# Patient Record
Sex: Female | Born: 1970 | Race: Black or African American | Hispanic: No | Marital: Married | Smoking: Never smoker
Health system: Southern US, Community
[De-identification: ages and names within clinical notes are randomized; demographics above are authoritative.]

## PROBLEM LIST (undated history)

## (undated) DIAGNOSIS — I471 Supraventricular tachycardia, unspecified: Secondary | ICD-10-CM

## (undated) DIAGNOSIS — M1990 Unspecified osteoarthritis, unspecified site: Secondary | ICD-10-CM

## (undated) DIAGNOSIS — K589 Irritable bowel syndrome without diarrhea: Secondary | ICD-10-CM

## (undated) DIAGNOSIS — S0300XA Dislocation of jaw, unspecified side, initial encounter: Secondary | ICD-10-CM

## (undated) DIAGNOSIS — G43909 Migraine, unspecified, not intractable, without status migrainosus: Secondary | ICD-10-CM

## (undated) DIAGNOSIS — T7840XA Allergy, unspecified, initial encounter: Secondary | ICD-10-CM

## (undated) DIAGNOSIS — H6690 Otitis media, unspecified, unspecified ear: Secondary | ICD-10-CM

## (undated) DIAGNOSIS — K219 Gastro-esophageal reflux disease without esophagitis: Secondary | ICD-10-CM

## (undated) DIAGNOSIS — D649 Anemia, unspecified: Secondary | ICD-10-CM

## (undated) HISTORY — PX: LAPAROSCOPIC TOTAL HYSTERECTOMY: SUR800

## (undated) HISTORY — DX: Allergy, unspecified, initial encounter: T78.40XA

## (undated) HISTORY — DX: Anemia, unspecified: D64.9

## (undated) HISTORY — PX: TYMPANOSTOMY TUBE PLACEMENT: SHX32

## (undated) HISTORY — PX: UPPER GASTROINTESTINAL ENDOSCOPY: SHX188

## (undated) HISTORY — DX: Gastro-esophageal reflux disease without esophagitis: K21.9

## (undated) HISTORY — DX: Unspecified osteoarthritis, unspecified site: M19.90

---

## 2001-05-16 ENCOUNTER — Other Ambulatory Visit: Admission: RE | Admit: 2001-05-16 | Discharge: 2001-05-16 | Payer: Self-pay | Admitting: Obstetrics and Gynecology

## 2002-05-26 ENCOUNTER — Ambulatory Visit (HOSPITAL_COMMUNITY): Admission: RE | Admit: 2002-05-26 | Discharge: 2002-05-26 | Payer: Self-pay | Admitting: Internal Medicine

## 2002-05-26 ENCOUNTER — Encounter (INDEPENDENT_AMBULATORY_CARE_PROVIDER_SITE_OTHER): Payer: Self-pay | Admitting: Internal Medicine

## 2002-05-29 ENCOUNTER — Other Ambulatory Visit: Admission: RE | Admit: 2002-05-29 | Discharge: 2002-05-29 | Payer: Self-pay | Admitting: Obstetrics and Gynecology

## 2006-07-19 ENCOUNTER — Other Ambulatory Visit: Admission: RE | Admit: 2006-07-19 | Discharge: 2006-07-19 | Payer: Self-pay | Admitting: Obstetrics & Gynecology

## 2006-08-02 ENCOUNTER — Ambulatory Visit (HOSPITAL_COMMUNITY): Admission: RE | Admit: 2006-08-02 | Discharge: 2006-08-02 | Payer: Self-pay | Admitting: Obstetrics & Gynecology

## 2009-12-13 ENCOUNTER — Ambulatory Visit (HOSPITAL_COMMUNITY): Admission: RE | Admit: 2009-12-13 | Discharge: 2009-12-13 | Payer: Self-pay | Admitting: Pulmonary Disease

## 2010-03-07 ENCOUNTER — Encounter (INDEPENDENT_AMBULATORY_CARE_PROVIDER_SITE_OTHER): Payer: Self-pay | Admitting: Obstetrics & Gynecology

## 2010-03-07 ENCOUNTER — Ambulatory Visit (HOSPITAL_COMMUNITY): Admission: RE | Admit: 2010-03-07 | Discharge: 2010-03-08 | Payer: Self-pay | Admitting: Obstetrics & Gynecology

## 2010-03-15 ENCOUNTER — Inpatient Hospital Stay (HOSPITAL_COMMUNITY): Admission: AD | Admit: 2010-03-15 | Discharge: 2010-03-15 | Payer: Self-pay | Admitting: Obstetrics & Gynecology

## 2010-03-15 DIAGNOSIS — R300 Dysuria: Secondary | ICD-10-CM

## 2010-03-15 DIAGNOSIS — N390 Urinary tract infection, site not specified: Secondary | ICD-10-CM

## 2010-11-01 LAB — PREGNANCY, URINE: Preg Test, Ur: NEGATIVE

## 2010-11-01 LAB — BASIC METABOLIC PANEL
BUN: 7 mg/dL (ref 6–23)
CO2: 25 mEq/L (ref 19–32)
CO2: 28 mEq/L (ref 19–32)
Calcium: 8.3 mg/dL — ABNORMAL LOW (ref 8.4–10.5)
Calcium: 9.4 mg/dL (ref 8.4–10.5)
Chloride: 102 mEq/L (ref 96–112)
Creatinine, Ser: 0.7 mg/dL (ref 0.4–1.2)
Creatinine, Ser: 0.74 mg/dL (ref 0.4–1.2)
GFR calc Af Amer: >60 mL/min (ref >60)
GFR calc Af Amer: >60 mL/min (ref >60)
GFR calc non Af Amer: >60 mL/min (ref >60)
GFR calc non Af Amer: >60 mL/min (ref >60)
Glucose, Bld: 75 mg/dL (ref 70–99)
Glucose, Bld: 91 mg/dL (ref 70–99)
Potassium: 3.3 mEq/L — ABNORMAL LOW (ref 3.5–5.1)
Sodium: 136 mEq/L (ref 135–145)
Sodium: 138 mEq/L (ref 135–145)

## 2010-11-01 LAB — URINE CULTURE: Colony Count: 35000

## 2010-11-01 LAB — URINALYSIS, ROUTINE W REFLEX MICROSCOPIC
Bilirubin Urine: NEGATIVE
Nitrite: NEGATIVE
Protein, ur: NEGATIVE mg/dL
Urobilinogen, UA: 0.2 mg/dL (ref 0.0–1.0)

## 2010-11-01 LAB — CBC
HCT: 36 % (ref 36.0–46.0)
Hemoglobin: 10.4 g/dL — ABNORMAL LOW (ref 12.0–15.0)
Hemoglobin: 12.1 g/dL (ref 12.0–15.0)
MCH: 28.5 pg (ref 26.0–34.0)
MCH: 28.7 pg (ref 26.0–34.0)
MCHC: 33.4 g/dL (ref 30.0–36.0)
MCHC: 33.5 g/dL (ref 30.0–36.0)
MCV: 84.9 fL (ref 78.0–100.0)
Platelets: 172 10*3/uL (ref 150–400)
RBC: 4.25 MIL/uL (ref 3.87–5.11)
RDW: 15.4 % (ref 11.5–15.5)
RDW: 15.5 % (ref 11.5–15.5)
WBC: 5.2 10*3/uL (ref 4.0–10.5)

## 2010-11-01 LAB — SURGICAL PCR SCREEN
MRSA, PCR: NEGATIVE
Staphylococcus aureus: NEGATIVE

## 2011-01-02 NOTE — Op Note (Signed)
Tracy Wise, Tracy Wise                ACCOUNT NO.:  [id number]   MEDICAL RECORD NO.:  [id number]          PATIENT TYPE:  AMB   LOCATION:  SDC                           FACILITY:  [facility]   PHYSICIAN:  M. Leda Quail, MD  DATE OF BIRTH:  [birth year]   DATE OF PROCEDURE:  DATE OF DISCHARGE:                               OPERATIVE REPORT   PREOPERATIVE DIAGNOSIS:  26. 40 year old G2, P2 married [place] American female with undesired      fertility.  2. Anemia.  3. History of migraines.  4. History of irritable bowel syndrome.   POSTOPERATIVE DIAGNOSES:  41. 41 year old G2, P2 married [place] American female with undesired      fertility.  2. Anemia.  3. History of migraines.  4. History of irritable bowel syndrome.   PROCEDURE:  Laparoscopic bilateral tubal ligation with Filshie clips.   SURGEON:  M. Leda Quail, M.D.   ASSISTANT:  OR staff   ANESTHESIA:  General endotracheal.   SPECIMENS:  None.   ESTIMATED BLOOD LOSS:  Minimal.   URINE OUTPUT:  50 mL of clear urine.   FLUIDS:  1000 mL of lactated Ringer's.   COMPLICATIONS:  None.   INDICATIONS FOR PROCEDURE:  [name] is a 40 year old African American  female who has two children.  She decided that she wants to proceed with  permanent sterilization.  She has had an IUD in place, and this was  removed at the time of her annual exam about 3 weeks ago.  She has not  been sexually active since the IUD was removed.  She had her last  menstrual period last week.  The patient understands that the failure  rate is approximately 1 in 250, and she has been counseled about the  risk for ectopic pregnancy if she does get pregnant in the future.  She  is aware of all of these risks and ready to proceed.   DESCRIPTION OF PROCEDURE:  The patient was taken to the operating room.  She was placed in the supine position.  General endotracheal anesthesia  was administered by the anesthesia staff without difficulty.  The legs  were  positioned in a low lithotomy position in the [place] stirrups.  The  abdomen, perineum, inner thighs, and vagina were prepped in the normal  sterile fashion.  A Foley catheter was used to drain the bladder of all  urine.  This was only done by an in and out catheterization.   A speculum was placed into the vagina.  The anterior lip of the cervix  was grasped with a single-tooth tenaculum, and an acorn uterine  manipulator was advanced to the cervical os and attached to the  tenaculum as a means of manipulating the uterus during the procedure.  The speculum was removed.  The patient was draped in the normal sterile  fashion.  Sterile gloves were changed, and attention was turned to the  abdomen.   6 cc of 0.5% Marcaine were instilled beneath the umbilicus.  A 10-mm  skin incision was made with a #11 blade.  The subcutaneous fat and  tissue were dissected down to the fascia with hemostats.  The sacral  promontory was palpated.  The abdominal wall area was elevated.  A  Veress needle was placed intra-abdominally, aiming towards the pelvis  and beneath the level of the sacral promontory.  Once the peritoneal  layer was popped through, a syringe of normal saline was obtained.  This  was attached to the Veress needle.  The first shift was aspirated.  No  fluid or blood was aspirated back.  Then fluid was injected without  difficulty and then aspirated again without any other fluid returning.  Finally, a drip test was performed, and fluid dripped easily into the  abdomen.  Next, a low flow of CO2 gas was attached to the Veress needle,  and pneumoperitoneum was achieved without difficulty.  There were low  pressures present throughout the entire time while this was being  performed.  Pressures were less than 10 mmHg.  Once 2.5 L of CO2 gas was  instilled, the Veress needle was removed.  A #10/11 bladed trocar and  port were inserted with direct entry.  The abdominal wall layer was  elevated, and  this was aimed again towards the pelvis and below the  level of the sacral promontory.  The trocar was removed, and the  laparoscope was used to confirm intraperitoneal placement.  The patient  was then placed in some Trendelenburg.  The bowel was scooped out of the  pelvis.  The uterus and fallopian tubes and ovaries were well-  visualized.  Photo documentation was made.  The upper abdomen was  surveyed, and photo documentation was made.  No abnormalities were  noted.  The appendix was not visualized.  The isthmic region of the  fallopian tubes were identified, and the fallopian tubes were both  identified all the way out to their fimbriated end.  The right fallopian  tube was then clamped in the isthmic region completely across the  fallopian tube, and a Filshie clip was clamped tightly.  In a similar  fashion, the left fallopian tube was grasped at the isthmic region with  a Filshie clip which was then tightened.  The Filshie clip traversed the  entire fallopian tube.  At this point, photo documentation was made.  There was no bleeding noted.  All instruments were removed from the  vagina, and then pneumoperitoneum was released.  The midline port was  removed.  The operator's finger was inserted into the incision to insure  that there was no bowel present in the incision.   The fascia was closed with a figure-of-eight suture of 2-0 Vicryl.  The  skin was then closed with a subcuticular stitch of 3-0 Vicryl Rapide.  The incision was closed with Dermabond.   The instruments were removed from the vagina.  There was minimal  bleeding from the cervix.   Sponge, lap, needle, and instrument counts were correct x2.  The patient  tolerated the procedure well.  She was extubated from general  endotracheal anesthesia without difficulty and taken to the recovery  room in stable condition.      Lum Keas, MD  Electronically Signed    MSM/MEDQ  D:  08/02/2006  T:  08/02/2006   Job:  [id number]

## 2011-01-02 NOTE — Consult Note (Signed)
NAME:  Tracy, Wise [facility]                  ACCOUNT NO.:  [id number]   MEDICAL RECORD NO.:  [id number]                  PATIENT TYPE:   LOCATION:                                       FACILITY:   PHYSICIAN:  Lionel December, M.D.                 DATE OF BIRTH:  1971/03/26   DATE OF CONSULTATION:  05/22/2002  DATE OF DISCHARGE:                                   CONSULTATION   REASON FOR CONSULTATION:  Nausea, abdominal pain, IBS.   HISTORY OF PRESENT ILLNESS:  Tracy Wise is a pleasant 40 year old black  female patient of Dr. Juanetta Gosling, who presents today for further evaluation of  abdominal pain, nausea, and IBS.  She tells me that one and a half years  ago, she saw Dr. Juanetta Gosling for abdominal pain associated with alternating  constipation and diarrhea.  She was diagnosed with IBS and started on NuLev.  NuLev helps control these symptoms.  However, about a month ago, she was  treated for respiratory infection with Biaxin.  She developed nausea and  vomiting related to this medication.  She was switched to Levaquin.  Shortly  after completing this antibiotic, she developed lots of postprandial nausea,  epigastric pain, and worsening of her IBS-type symptoms.  She complains of  epigastric pain which she describes as a dull ache.  It seems to be worsened  with eating.  She takes NuLev which does help some.  She continues to have  alternating constipation and diarrhea.  She will generally have a bowel  movement every 2-3 days and when she develops diarrhea will have a few loose  stools at a time.  She denies any melena or rectal bleeding.  She has been  off of antibiotics now for 3-4 weeks.  She denies any fevers or chills.  She  complains of a lot of her abdominal bloating and gas.  She denies any  heartburn-type symptoms, dysphagia, or odynophagia.  The nausea and vomiting  has now resolved, but she continues to have epigastric pain.   She is on Excedrin and Butalbital compound p.r.n.  headache.  Excedrin has  aspirin in it and most Butalbital compounds do also.  She has been on both  of these medications at least a year and a half.   CURRENT MEDICATIONS:  1. NuLev p.r.n.  2. Excedrin 1-2 per week.  3. Butalbital compound approximately once per week.   ALLERGIES:  AMOXACILLIN causes UTI.  BIAXIN causes NAUSEA and VOMITING.   PAST MEDICAL HISTORY:  Migraine headache.   PAST SURGICAL HISTORY:  1. Left breast cyst, benign.  2. Wisdom teeth extraction, four.   FAMILY HISTORY:  No family history of chronic GI illnesses or colorectal  cancer.   SOCIAL HISTORY:  She is married for 10 years and has two children.  She is  employed with [employer] as a Librarian, academic.  She has never been as smoker.  She denies any  alcohol use.   REVIEW OF SYSTEMS:  Please see HPI for GI.  CARDIOPULMONARY:  Recently  treated for respiratory infection which has now resolved.   PHYSICAL EXAMINATION:  VITAL SIGNS:  Weight 142.7 pounds, height 5 feet 6  inches, temperature 98.5, blood pressure 100/64, pulse 68.  GENERAL:  A very pleasant, well-nourished, well-developed, young black  female in no acute distress.  SKIN:  Warm and dry, no jaundice.  HEENT:  Conjunctivae are pink, sclerae are nonicteric, pupils equal, round,  and reactive to light.  Oropharyngeal mucosa moist and pink.  No lesions,  erythema, or exudate.  NECK:  No lymphadenopathy or thyromegaly.  CHEST:  Lungs are clear to auscultation.  CARDIAC:  Reveals regular rate and rhythm, normal S1, S2.  No murmurs, rubs,  or gallops.  ABDOMEN:  Positive bowel sounds, soft, nondistended.  She has mild  tenderness in the epigastric region to deep palpation.  Mild left lower  quadrant tenderness.  No organomegaly or masses.  EXTREMITIES:  No edema.   IMPRESSION:  Tracy Wise is a pleasant 40 year old female who has a one and  half year history of IBS-type symptoms.  These seemed to have flared up   recently with antibiotic treatment for respiratory infection.  More  worrisome, however, is she has epigastric pain in the setting of chronic  aspirin use for migraine headaches.  Her symptoms have been more progressive  over the last several weeks and it is unclear whether her nausea is related  to this or to recent antibiotic therapy.  She is concerned that she may have  an ulcer and would like to proceed with upper endoscopy which I feel is  reasonable.   PLAN:  1. She will continue NuLev p.r.n.  2. Fiber Choice 2 tablets daily.  3. Aciphex 20 mg p.o. q.a.m., #14 samples given.  4. An EGD in the near future.  I discussed risks, alternatives, and benefits     with the patient and she is agreeable to proceed with the EGD.   I would like to thank Dr. Juanetta Gosling for allowing [place] to take part in the care  of this patient.     Tana Coast, P.A.                        Lionel December, M.D.    LL/MEDQ  D:  05/22/2002  T:  05/23/2002  Job:  [id number]

## 2012-01-20 ENCOUNTER — Ambulatory Visit (INDEPENDENT_AMBULATORY_CARE_PROVIDER_SITE_OTHER): Payer: Self-pay | Admitting: Physician Assistant

## 2012-01-20 VITALS — BP 99/61 | HR 76 | Temp 98.2°F | Resp 16 | Ht 66.0 in | Wt 170.0 lb

## 2012-01-20 DIAGNOSIS — H9209 Otalgia, unspecified ear: Secondary | ICD-10-CM

## 2012-01-20 DIAGNOSIS — H6690 Otitis media, unspecified, unspecified ear: Secondary | ICD-10-CM

## 2012-01-20 MED ORDER — LEVOFLOXACIN 500 MG PO TABS: 500.0000 mg | ORAL_TABLET | Freq: Every day | ORAL | Status: AC

## 2012-01-20 MED ORDER — IPRATROPIUM BROMIDE 0.06 % NA SOLN: 2.0000 | Freq: Three times a day (TID) | NASAL | Status: DC

## 2012-01-20 MED ORDER — TRAMADOL HCL 50 MG PO TABS: 50.0000 mg | ORAL_TABLET | Freq: Three times a day (TID) | ORAL | Status: AC | PRN

## 2012-01-20 NOTE — Progress Notes (Signed)
Patient ID: Tracy Wise MRN: [id number], DOB: 1971-06-05, 41 y.o. Date of Encounter: 01/20/2012, 6:33 PM  Primary Physician: No primary provider on file.  Chief Complaint:  Chief Complaint  Patient presents with  . Otalgia    x 1wk    HPI: 41 y.o. year old female presents with a seven day history of mild right otalgia. She unfortunately had about 5-6 episodes of AOM the previous year causing her to under go a myringotomy tube in the fall. She states the tube may have fallen out about 2-3 weeks ago. Symptoms began with nasal congestion, sore throat, and cough. Afebrile. Nasal congestion thick and green/yellow. Cough is nonproductive. Right ear painful and feels full, leading to sensation of muffled hearing. No drainage or discharge from affected ear. Has tried OTC cold preps without success. No GI complaints. Appetite normal.  No sick contacts, recent antibiotics, or recent travels.    No past medical history on file.   Home Meds: Prior to Admission medications   Medication Sig Start Date End Date Taking? Authorizing Provider  hyoscyamine (ANASPAZ) 0.125 MG TBDP Place 0.125 mg under the tongue.   Yes Historical Provider, MD  promethazine (PHENERGAN) 25 MG tablet Take 25 mg by mouth every 6 (six) hours as needed.   Yes Historical Provider, MD  rizatriptan (MAXALT) 10 MG tablet Take 10 mg by mouth as needed. May repeat in 2 hours if needed   Yes Historical Provider, MD                         Allergies:  Allergies  Allergen Reactions  . Amoxicillin     History   Social History  . Marital Status: Married    Spouse Name: N/A    Number of Children: N/A  . Years of Education: N/A   Occupational History  . Not on file.   Social History Main Topics  . Smoking status: Never Smoker   . Smokeless tobacco: Not on file  . Alcohol Use: Not on file  . Drug Use: Not on file  . Sexually Active: Not on file   Other Topics Concern  . Not on file   Social History Narrative    . No narrative on file     Review of Systems: Constitutional: negative for chills, fever, night sweats or weight changes HEENT: see above Respiratory: negative for hemoptysis, wheezing, or shortness of breath Abdominal: negative for abdominal pain, nausea, vomiting or diarrhea Dermatological: negative for rash Neurologic: negative for headache   Physical Exam: Blood pressure 99/61, pulse 76, temperature 98.2 F (36.8 C), temperature source Oral, resp. rate 16, height 5\' 6"  (1.676 m), weight 170 lb (77.111 kg)., Body mass index is 27.44 kg/(m^2). General: Well developed, well nourished, in no acute distress. Head: Normocephalic, atraumatic, eyes without discharge, sclera non-icteric, nares are congested. Left auditory canal clear. Right auditory canal with myringotomy tube along the base stuck in wax. Right TM erythematous, dull, and bulging with purulent effusion behind. No perforation visualized. Contralateral TM pearly grey with reflective cone of light, no effusion or perforation visualized. Oral cavity moist, dentition normal. Posterior pharynx with post nasal drip and mild erythema. No peritonsillar abscess or tonsillar exudate. Neck: Supple. No thyromegaly. Full ROM. No lymphadenopathy. Lungs: Clear bilaterally to auscultation without wheezes, rales, or rhonchi. Breathing is unlabored.  Heart: RRR with S1 S2. No murmurs, rubs, or gallops appreciated. Msk:  Strength and tone normal for age. Extremities: No clubbing or  cyanosis. No edema. Neuro: Alert and oriented X 3. Moves all extremities spontaneously. CNII-XII grossly in tact. Psych:  Responds to questions appropriately with a normal affect.     ASSESSMENT AND PLAN:  41 y.o. year old female with acute otitis media of right ear without perforation. -Levaquin 500 mg 1 po daily #10 no RF -Atrovent NS 0.06% 2 sprays each nare bid prn #1 no RF --Ultram 50 mg 1 po tid prn pain #30 no RF, SED -Mucinex  -Tylenol  prn -Rest/fluids -Follow up with ENT -RTC precautions -RTC 3 days if no improvement  Signed, Eula Listen, PA-C 01/20/2012 6:33 PM

## 2012-12-25 ENCOUNTER — Ambulatory Visit (INDEPENDENT_AMBULATORY_CARE_PROVIDER_SITE_OTHER): Payer: BC Managed Care – PPO | Admitting: Internal Medicine

## 2012-12-25 VITALS — BP 109/69 | HR 86 | Temp 98.3°F | Resp 16 | Ht 64.5 in | Wt 179.0 lb

## 2012-12-25 DIAGNOSIS — J029 Acute pharyngitis, unspecified: Secondary | ICD-10-CM

## 2012-12-25 DIAGNOSIS — J209 Acute bronchitis, unspecified: Secondary | ICD-10-CM

## 2012-12-25 MED ORDER — AZITHROMYCIN 500 MG PO TABS: 500.0000 mg | ORAL_TABLET | Freq: Every day | ORAL | Status: DC

## 2012-12-25 MED ORDER — HYDROCODONE-ACETAMINOPHEN 7.5-325 MG/15ML PO SOLN: 5.0000 mL | Freq: Four times a day (QID) | ORAL | Status: DC | PRN

## 2012-12-25 NOTE — Progress Notes (Signed)
  Subjective:    Patient ID: Tracy Wise, female    DOB: 11-Oct-1970, 42 y.o.   MRN: [id number]  HPI 3d progressive st and cough. No sob,cp.   Review of Systems     Objective:   Physical Exam  Vitals reviewed. Constitutional: She is oriented to person, place, and time. She appears well-developed and well-nourished.  HENT:  Right Ear: External ear normal.  Left Ear: External ear normal.  Nose: Mucosal edema and rhinorrhea present.  Mouth/Throat: Oropharyngeal exudate present.  Eyes: EOM are normal.  Neck: Neck supple.  Cardiovascular: Normal rate, regular rhythm and normal heart sounds.   Pulmonary/Chest: Effort normal. She has rhonchi.  Abdominal: Soft.  Neurological: She is alert and oriented to person, place, and time. She exhibits normal muscle tone. Coordination normal.  Skin: No rash noted.  Psychiatric: She has a normal mood and affect.          Assessment & Plan:  Bronchitis Zpak/Lortab

## 2012-12-25 NOTE — Patient Instructions (Signed)

## 2012-12-26 ENCOUNTER — Telehealth: Payer: Self-pay

## 2012-12-26 NOTE — Telephone Encounter (Signed)
Pt was seen by dr guest on 12/25/12 for an illness. She had a note to be out of Monday 12/26/12, but pt states not better and needs a note to be out on 12/27/12, as well Please call pt to advise

## 2012-12-27 NOTE — Telephone Encounter (Signed)
Note provided. Called her to advise. Tracy Wise

## 2013-01-22 ENCOUNTER — Other Ambulatory Visit: Payer: Self-pay | Admitting: Internal Medicine

## 2013-01-25 ENCOUNTER — Other Ambulatory Visit: Payer: Self-pay | Admitting: Internal Medicine

## 2013-01-28 ENCOUNTER — Telehealth: Payer: Self-pay

## 2013-01-28 NOTE — Telephone Encounter (Signed)
Advised pt to come in for an ov. She has new cough that has presented this week.

## 2013-01-28 NOTE — Telephone Encounter (Signed)
PT CALLED AND WOULD LIKE A REFILL OF COUGH SYRUP. SHE USES THE [facility] IN [place] PLEASE CALL [phone]

## 2014-03-18 ENCOUNTER — Ambulatory Visit (INDEPENDENT_AMBULATORY_CARE_PROVIDER_SITE_OTHER): Payer: BC Managed Care – PPO

## 2014-03-18 ENCOUNTER — Ambulatory Visit: Payer: BC Managed Care – PPO

## 2014-03-18 ENCOUNTER — Ambulatory Visit (INDEPENDENT_AMBULATORY_CARE_PROVIDER_SITE_OTHER): Payer: BC Managed Care – PPO | Admitting: Emergency Medicine

## 2014-03-18 VITALS — BP 104/66 | HR 79 | Temp 98.4°F | Resp 16 | Ht 65.0 in | Wt 189.5 lb

## 2014-03-18 DIAGNOSIS — M6530 Trigger finger, unspecified finger: Secondary | ICD-10-CM

## 2014-03-18 DIAGNOSIS — M79644 Pain in right finger(s): Secondary | ICD-10-CM

## 2014-03-18 DIAGNOSIS — M79609 Pain in unspecified limb: Secondary | ICD-10-CM

## 2014-03-18 MED ORDER — MELOXICAM 15 MG PO TABS: 15.0000 mg | ORAL_TABLET | Freq: Every day | ORAL | Status: DC

## 2014-03-18 NOTE — Progress Notes (Signed)
[facility] [address], [place] [id number] [phone]  Date:  03/18/2014   Name:  Tracy LeschesMartha C Wise   DOB:  07/02/1971   MRN:  [id number]  PCP:  No primary provider on file.    Chief Complaint: Finger Injury   History of Present Illness:  Tracy LeschesMartha C Wise is a 43 y.o. very pleasant female patient who presents with the following:  Several day history of pain in right second finger.  Says her PIP joint is painful and swelling and clicks frequently.  No history of injury or overuse.  No history of arthritis.  No improvement with over the counter medications or other home remedies. Denies other complaint or health concern today.   There are no active problems to display for this patient.   No past medical history on file.  No past surgical history on file.  History  Substance Use Topics  . Smoking status: Never Smoker   . Smokeless tobacco: Never Used  . Alcohol Use: No    Family History  Problem Relation Age of Onset  . Migraines Father     Allergies  Allergen Reactions  . Amoxicillin     Medication list has been reviewed and updated.  Current Outpatient Prescriptions on File Prior to Visit  Medication Sig Dispense Refill  . cyclobenzaprine (FLEXERIL) 10 MG tablet Take 10 mg by mouth as needed for muscle spasms.      . hyoscyamine (ANASPAZ) 0.125 MG TBDP Place 0.125 mg under the tongue.      Marland Kitchen. ipratropium (ATROVENT) 0.06 % nasal spray Place 2 sprays into the nose 3 (three) times daily.  15 mL  0  . promethazine (PHENERGAN) 25 MG tablet Take 25 mg by mouth every 6 (six) hours as needed.      . rizatriptan (MAXALT) 10 MG tablet Take 10 mg by mouth as needed. May repeat in 2 hours if needed       No current facility-administered medications on file prior to visit.    Review of Systems:  As per HPI, otherwise negative.    Physical Examination: Filed Vitals:   03/18/14 1541  BP: 104/66  Pulse: 79  Temp: 98.4 F (36.9 C)  Resp: 16    Filed Vitals:   03/18/14 1541  Height: 5\' 5"  (1.651 m)  Weight: 189 lb 8 oz (85.957 kg)   Body mass index is 31.53 kg/(m^2). Ideal Body Weight: Weight in (lb) to have BMI = 25: 149.9   GEN: WDWN, NAD, Non-toxic, Alert & Oriented x 3 HEENT: Atraumatic, Normocephalic.  Ears and Nose: No external deformity. EXTR: No clubbing/cyanosis/edema NEURO: Normal gait.  PSYCH: Normally interactive. Conversant. Not depressed or anxious appearing.  Calm demeanor.  RIGHT hand:  No swelling or effusion second finger Full AROM/PROM.  Assessment and Plan: Trigger finger mobic Hand follow up if no improvement. Signed,  Phillips OdorJeffery Anderson, MD   [facility] reading (PRIMARY) by  Dr. Dareen PianoAnderson.  Negative finger.

## 2014-03-18 NOTE — Patient Instructions (Signed)
Trigger Finger Trigger finger (digital tendinitis and stenosing tenosynovitis) is a common disorder that causes an often painful catching of the fingers or thumb. It occurs as a clicking, snapping, or locking of a finger in the palm of the hand. This is caused by a problem with the tendons that flex or bend the fingers sliding smoothly through their sheaths. The condition may occur in any finger or a couple fingers at the same time.  The finger may lock with the finger curled or suddenly straighten out with a snap. This is more common in patients with rheumatoid arthritis and diabetes. Left untreated, the condition may get worse to the point where the finger becomes locked in flexion, like making a fist, or less commonly locked with the finger straightened out. CAUSES   Inflammation and scarring that lead to swelling around the tendon sheath.  Repeated or forceful movements.  Rheumatoid arthritis, an autoimmune disease that affects joints.  Gout.  Diabetes mellitus. SIGNS AND SYMPTOMS  Soreness and swelling of your finger.  A painful clicking or snapping as you bend and straighten your finger. DIAGNOSIS  Your health care provider will do a physical exam of your finger to diagnose trigger finger. TREATMENT   Splinting for 6-8 weeks may be helpful.  Nonsteroidal anti-inflammatory medicines (NSAIDs) can help to relieve the pain and inflammation.  Cortisone injections, along with splinting, may speed up recovery. Several injections may be required. Cortisone may give relief after one injection.  Surgery is another treatment that may be used if conservative treatments do not work. Surgery can be minor, without incisions (a cut does not have to be made), and can be done with a needle through the skin.  Other surgical choices involve an open procedure in which the surgeon opens the hand through a small incision and cuts the pulley so the tendon can again slide smoothly. Your hand will still  work fine. HOME CARE INSTRUCTIONS  Apply ice to the injured area, twice per day:  Put ice in a plastic bag.  Place a towel between your skin and the bag.  Leave the ice on for 20 minutes, 3-4 times a day.  Rest your hand often. MAKE SURE YOU:   Understand these instructions.  Will watch your condition.  Will get help right away if you are not doing well or get worse. Document Released: 05/23/2004 Document Revised: 04/05/2013 Document Reviewed: 01/03/2013 [employer] Patient Information 2015 [employer]. This information is not intended to replace advice given to you by your health care provider. Make sure you discuss any questions you have with your health care provider.  

## 2016-03-02 ENCOUNTER — Encounter (HOSPITAL_COMMUNITY): Payer: Self-pay | Admitting: Emergency Medicine

## 2016-03-02 ENCOUNTER — Ambulatory Visit (HOSPITAL_COMMUNITY)
Admission: EM | Admit: 2016-03-02 | Discharge: 2016-03-02 | Disposition: A | Discharge status: Home / Self Care | Payer: BLUE CROSS/BLUE SHIELD | Attending: Family Medicine | Admitting: Family Medicine

## 2016-03-02 DIAGNOSIS — S39012A Strain of muscle, fascia and tendon of lower back, initial encounter: Secondary | ICD-10-CM

## 2016-03-02 DIAGNOSIS — H9203 Otalgia, bilateral: Secondary | ICD-10-CM

## 2016-03-02 DIAGNOSIS — H6504 Acute serous otitis media, recurrent, right ear: Secondary | ICD-10-CM

## 2016-03-02 MED ORDER — CEFDINIR 300 MG PO CAPS: 300.0000 mg | ORAL_CAPSULE | Freq: Two times a day (BID) | ORAL | Status: DC

## 2016-03-02 MED ORDER — PREDNISONE 20 MG PO TABS: ORAL_TABLET | ORAL | Status: DC

## 2016-03-02 NOTE — ED Provider Notes (Signed)
CSN: [id number]     Arrival date & time 03/02/16  1847 History   First MD Initiated Contact with Patient 03/02/16 1908     Chief Complaint  Patient presents with  . Back Pain  . Ear Problem   (Consider location/radiation/quality/duration/timing/severity/associated sxs/prior Treatment) HPI  Tracy Wise is a 45 y.o. female presenting to UC with c/o lower back pain that started 3 days ago after she twisted to the Left. Pain was severe, aching and sharp, worse with activity and position changes. She notes she was in a wheelchair at work the last 2 days but pain is significantly better today.  She has been taking flexeril and ibuprofen with moderate temporary relief. Reports hx of lower back pain in the past but not this severe. Pain does not radiate. Denies numbness or weakness in arms or legs. No known injury. Denies hx of back surgeries.  Pt also c/o bilateral ear pain that has gradually worsened for 1 month.  Pt reports ear "fullness' she was dx with azithromycin about 5 weeks ago for same but states the fullness never resolved. She even tried her allergic medication, zyrtec w/o relief. Hx of recurrent ear infections. She does have an ENT she f/u with but has not seen in a while. She has had ear tubes in the past and notes she knows she still has a hole in her Right ear and hopes it is still there so she doesn't need tubes again. Denies fever, chills, n/v/d.    History reviewed. No pertinent past medical history. History reviewed. No pertinent past surgical history. Family History  Problem Relation Age of Onset  . Migraines Father    Social History  Substance Use Topics  . Smoking status: Never Smoker   . Smokeless tobacco: Never Used  . Alcohol Use: No   OB History    No data available     Review of Systems  Constitutional: Negative for fever and chills.  HENT: Positive for ear pain (bilateral ear fullness). Negative for congestion, ear discharge, rhinorrhea, sore throat, trouble  swallowing and voice change.   Respiratory: Negative for cough and shortness of breath.   Cardiovascular: Negative for chest pain and palpitations.  Gastrointestinal: Negative for nausea, vomiting, abdominal pain and diarrhea.  Musculoskeletal: Positive for myalgias (lower back) and back pain. Negative for arthralgias and gait problem.  Skin: Negative for rash.    Allergies  Amoxicillin  Home Medications   Prior to Admission medications   Medication Sig Start Date End Date Taking? Authorizing Provider  cyclobenzaprine (FLEXERIL) 10 MG tablet Take 10 mg by mouth as needed for muscle spasms.   Yes Historical Provider, MD  promethazine (PHENERGAN) 25 MG tablet Take 25 mg by mouth every 6 (six) hours as needed.   Yes Historical Provider, MD  cefdinir (OMNICEF) 300 MG capsule Take 1 capsule (300 mg total) by mouth 2 (two) times daily. 03/02/16   Junius FinnerErin O'Malley, PA-C  hyoscyamine (ANASPAZ) 0.125 MG TBDP Place 0.125 mg under the tongue.    Historical Provider, MD  ipratropium (ATROVENT) 0.06 % nasal spray Place 2 sprays into the nose 3 (three) times daily. 01/20/12   Sondra Bargesyan M Dunn, PA-C  meloxicam (MOBIC) 15 MG tablet Take 1 tablet (15 mg total) by mouth daily. 03/18/14   Carmelina DaneJeffery S Anderson, MD  predniSONE (DELTASONE) 20 MG tablet 3 tabs po day one, then 2 po daily x 4 days 03/02/16   Junius FinnerErin O'Malley, PA-C  rizatriptan (MAXALT) 10 MG tablet Take 10 mg  by mouth as needed. May repeat in 2 hours if needed    Historical Provider, MD   Meds Ordered and Administered this Visit  Medications - No data to display  BP 105/61 mmHg  Pulse 82  Temp(Src) 98.1 F (36.7 C) (Oral)  Resp 18  SpO2 99% No data found.   Physical Exam  Constitutional: She is oriented to person, place, and time. She appears well-developed and well-nourished.  HENT:  Head: Normocephalic and atraumatic.  Right Ear: Tympanic membrane is perforated and erythematous. Tympanic membrane is not bulging. A middle ear effusion ( oozing scant  amount of pus) is present.  Left Ear: Tympanic membrane normal. Tympanic membrane is not erythematous and not bulging.  Nose: Nose normal.  Mouth/Throat: Uvula is midline, oropharynx is clear and moist and mucous membranes are normal.  Eyes: EOM are normal.  Neck: Normal range of motion.  Cardiovascular: Normal rate.   Pulmonary/Chest: Effort normal. No respiratory distress.  Musculoskeletal: Normal range of motion. She exhibits tenderness. She exhibits no edema.  Tenderness over the SI joint, as well as lower lumbar muscles on Left and Right side. Negative straight leg raise. Normal gait. Full ROM upper and lower extremities bilaterally, 5/5 strength.  Neurological: She is alert and oriented to person, place, and time.  Skin: Skin is warm and dry. No rash noted. No erythema.  Psychiatric: She has a normal mood and affect. Her behavior is normal.  Nursing note and vitals reviewed.   ED Course  Procedures (including critical care time)  Labs Review Labs Reviewed - No data to display  Imaging Review No results found.    MDM   1. Low back strain, initial encounter   2. Ear pain, bilateral   3. Recurrent acute serous otitis media of right ear    Pt c/o lower back pain. No red flag symptoms. No indication for imaging at this time.  Exam also c/w Right AOM  Due to back pain improving significantly since onset of symptoms, encouraged pt to continue taking flexeril and ibuprofen as well as alternating ice and heat. Home exercises provided.  Rx: cefdinir and prednisone.  F/u with PCP and/or ENT in 1 week if not improving. Patient verbalized understanding and agreement with treatment plan.     Junius Finner, PA-C 03/02/16 1950

## 2016-03-02 NOTE — ED Notes (Signed)
Pt reports she twisted her back at work last Friday and felt pain... Pain increases w/actvity  Taking flexeril and ibup w/temp relief.   Also c/o bilateral ear fullness onset x1 month  A&O x4... NAD

## 2016-03-02 NOTE — Discharge Instructions (Signed)

## 2016-03-16 ENCOUNTER — Encounter (HOSPITAL_COMMUNITY): Payer: Self-pay | Admitting: *Deleted

## 2016-03-16 ENCOUNTER — Ambulatory Visit (HOSPITAL_COMMUNITY)
Admission: EM | Admit: 2016-03-16 | Discharge: 2016-03-16 | Disposition: A | Discharge status: Home / Self Care | Payer: BLUE CROSS/BLUE SHIELD | Attending: Family Medicine | Admitting: Family Medicine

## 2016-03-16 DIAGNOSIS — J300 Vasomotor rhinitis: Secondary | ICD-10-CM | POA: Diagnosis not present

## 2016-03-16 MED ORDER — HYDROCOD POLST-CPM POLST ER 10-8 MG/5ML PO SUER: 5.0000 mL | Freq: Two times a day (BID) | ORAL | 0 refills | Status: DC | PRN

## 2016-03-16 MED ORDER — IPRATROPIUM BROMIDE 0.06 % NA SOLN: 2.0000 | Freq: Four times a day (QID) | NASAL | 1 refills | Status: DC

## 2016-03-16 NOTE — ED Provider Notes (Signed)
[facility]    CSN: [id number] Arrival date & time: 03/16/16  4098  First Provider Contact:  None       History   Chief Complaint Chief Complaint  Patient presents with  . Cough    HPI Tracy Wise is a 45 y.o. female.    URI  Presenting symptoms: congestion, cough, ear pain, rhinorrhea and sore throat   Presenting symptoms: no fever   Severity:  Moderate Onset quality:  Gradual Duration:  1 week Progression:  Worsening Chronicity:  Recurrent Relieved by:  None tried Worsened by:  Nothing Ineffective treatments:  None tried Associated symptoms: wheezing     History reviewed. No pertinent past medical history.  There are no active problems to display for this patient.   History reviewed. No pertinent surgical history.  OB History    No data available       Home Medications    Prior to Admission medications   Medication Sig Start Date End Date Taking? Authorizing Provider  cefdinir (OMNICEF) 300 MG capsule Take 1 capsule (300 mg total) by mouth 2 (two) times daily. 03/02/16   Junius Finner, PA-C  cyclobenzaprine (FLEXERIL) 10 MG tablet Take 10 mg by mouth as needed for muscle spasms.    Historical Provider, MD  hyoscyamine (ANASPAZ) 0.125 MG TBDP Place 0.125 mg under the tongue.    Historical Provider, MD  ipratropium (ATROVENT) 0.06 % nasal spray Place 2 sprays into the nose 3 (three) times daily. 01/20/12   Sondra Barges, PA-C  meloxicam (MOBIC) 15 MG tablet Take 1 tablet (15 mg total) by mouth daily. 03/18/14   Carmelina Dane, MD  predniSONE (DELTASONE) 20 MG tablet 3 tabs po day one, then 2 po daily x 4 days 03/02/16   Junius Finner, PA-C  promethazine (PHENERGAN) 25 MG tablet Take 25 mg by mouth every 6 (six) hours as needed.    Historical Provider, MD  rizatriptan (MAXALT) 10 MG tablet Take 10 mg by mouth as needed. May repeat in 2 hours if needed    Historical Provider, MD    Family History Family History  Problem Relation Age of Onset    . Migraines Father     Social History Social History  Substance Use Topics  . Smoking status: Never Smoker  . Smokeless tobacco: Never Used  . Alcohol use No     Allergies   Amoxicillin   Review of Systems Review of Systems  Constitutional: Negative.  Negative for fever.  HENT: Positive for congestion, ear pain, postnasal drip, rhinorrhea and sore throat.   Respiratory: Positive for cough and wheezing. Negative for shortness of breath.   Cardiovascular: Negative.   All other systems reviewed and are negative.    Physical Exam Triage Vital Signs ED Triage Vitals  Enc Vitals Group     BP 03/16/16 1928 118/82     Pulse Rate 03/16/16 1928 98     Resp 03/16/16 1928 18     Temp 03/16/16 1928 98.2 F (36.8 C)     Temp Source 03/16/16 1928 Oral     SpO2 03/16/16 1928 98 %     Weight --      Height --      Head Circumference --      Peak Flow --      Pain Score 03/16/16 1929 3     Pain Loc --      Pain Edu? --      Excl. in [place]? --  No data found.   Updated Vital Signs BP 118/82 (BP Location: Right Arm)   Pulse 98   Temp 98.2 F (36.8 C) (Oral)   Resp 18   SpO2 98%   Visual Acuity Right Eye Distance:   Left Eye Distance:   Bilateral Distance:    Right Eye Near:   Left Eye Near:    Bilateral Near:     Physical Exam  Constitutional: She is oriented to person, place, and time. She appears well-developed and well-nourished. No distress.  HENT:  Right Ear: External ear normal.  Left Ear: External ear normal.  Mouth/Throat: Oropharynx is clear and moist.  Eyes: Conjunctivae and EOM are normal. Pupils are equal, round, and reactive to light.  Neck: Normal range of motion. Neck supple.  Cardiovascular: Normal rate, regular rhythm, normal heart sounds and intact distal pulses.   Pulmonary/Chest: Effort normal and breath sounds normal. She has no wheezes.  Lymphadenopathy:    She has no cervical adenopathy.  Neurological: She is alert and oriented to  person, place, and time.  Skin: Skin is warm and dry.  Nursing note and vitals reviewed.    UC Treatments / Results  Labs (all labs ordered are listed, but only abnormal results are displayed) Labs Reviewed - No data to display  EKG  EKG Interpretation None       Radiology No results found.  Procedures Procedures (including critical care time)  Medications Ordered in UC Medications - No data to display   Initial Impression / Assessment and Plan / UC Course  I have reviewed the triage vital signs and the nursing notes.  Pertinent labs & imaging results that were available during my care of the patient were reviewed by me and considered in my medical decision making (see chart for details).  Clinical Course      Final Clinical Impressions(s) / UC Diagnoses   Final diagnoses:  None    New Prescriptions New Prescriptions   No medications on file     Linna Hoff, MD 03/16/16 8657

## 2016-03-16 NOTE — ED Triage Notes (Signed)
Pt reports  Yellow  Phlegm  Production      Cough   Congested    And   sorethroat  With  Onset    X   4  Days  Ago   Recently   sev  Weeks  Ago  Anti  Biotics    And        Wheezing

## 2016-05-15 DIAGNOSIS — I471 Supraventricular tachycardia: Secondary | ICD-10-CM | POA: Insufficient documentation

## 2017-05-04 DIAGNOSIS — J00 Acute nasopharyngitis [common cold]: Secondary | ICD-10-CM | POA: Insufficient documentation

## 2017-05-26 ENCOUNTER — Other Ambulatory Visit (HOSPITAL_COMMUNITY): Payer: Self-pay | Admitting: Pulmonary Disease

## 2017-05-26 ENCOUNTER — Ambulatory Visit (HOSPITAL_COMMUNITY)
Admission: RE | Admit: 2017-05-26 | Discharge: 2017-05-26 | Disposition: A | Discharge status: Home / Self Care | Payer: BLUE CROSS/BLUE SHIELD | Source: Ambulatory Visit | Attending: Pulmonary Disease | Admitting: Pulmonary Disease

## 2017-05-26 DIAGNOSIS — M533 Sacrococcygeal disorders, not elsewhere classified: Secondary | ICD-10-CM | POA: Diagnosis not present

## 2017-05-26 DIAGNOSIS — M25551 Pain in right hip: Secondary | ICD-10-CM

## 2017-05-26 DIAGNOSIS — M5137 Other intervertebral disc degeneration, lumbosacral region: Secondary | ICD-10-CM | POA: Insufficient documentation

## 2018-06-24 ENCOUNTER — Other Ambulatory Visit: Payer: Self-pay | Admitting: Pulmonary Disease

## 2018-06-24 DIAGNOSIS — M545 Low back pain, unspecified: Secondary | ICD-10-CM

## 2018-06-24 DIAGNOSIS — G8929 Other chronic pain: Secondary | ICD-10-CM

## 2018-07-05 ENCOUNTER — Other Ambulatory Visit: Payer: Self-pay | Admitting: Pulmonary Disease

## 2018-07-05 ENCOUNTER — Ambulatory Visit
Admission: RE | Admit: 2018-07-05 | Discharge: 2018-07-05 | Disposition: A | Discharge status: Home / Self Care | Payer: BLUE CROSS/BLUE SHIELD | Source: Ambulatory Visit | Attending: Pulmonary Disease | Admitting: Pulmonary Disease

## 2018-07-05 DIAGNOSIS — M545 Low back pain, unspecified: Secondary | ICD-10-CM

## 2018-07-05 DIAGNOSIS — G8929 Other chronic pain: Secondary | ICD-10-CM

## 2018-07-05 MED ORDER — IOPAMIDOL (ISOVUE-M 200) INJECTION 41%
1.0000 mL | Freq: Once | INTRAMUSCULAR | Status: AC
  Administered 2018-07-05: 1 mL via EPIDURAL

## 2018-07-05 MED ORDER — METHYLPREDNISOLONE ACETATE 40 MG/ML INJ SUSP (RADIOLOG
120.0000 mg | Freq: Once | INTRAMUSCULAR | Status: AC
  Administered 2018-07-05: 120 mg via EPIDURAL

## 2018-07-05 NOTE — Discharge Instructions (Signed)

## 2018-08-25 ENCOUNTER — Other Ambulatory Visit: Payer: Self-pay | Admitting: Pulmonary Disease

## 2018-08-25 DIAGNOSIS — G8929 Other chronic pain: Secondary | ICD-10-CM

## 2018-08-25 DIAGNOSIS — M545 Low back pain: Principal | ICD-10-CM

## 2018-09-01 ENCOUNTER — Other Ambulatory Visit: Payer: Self-pay | Admitting: Pulmonary Disease

## 2018-09-01 ENCOUNTER — Ambulatory Visit
Admission: RE | Admit: 2018-09-01 | Discharge: 2018-09-01 | Disposition: A | Discharge status: Home / Self Care | Payer: BLUE CROSS/BLUE SHIELD | Source: Ambulatory Visit | Attending: Pulmonary Disease | Admitting: Pulmonary Disease

## 2018-09-01 DIAGNOSIS — M545 Low back pain, unspecified: Secondary | ICD-10-CM

## 2018-09-01 DIAGNOSIS — G8929 Other chronic pain: Secondary | ICD-10-CM

## 2018-09-01 MED ORDER — METHYLPREDNISOLONE ACETATE 40 MG/ML INJ SUSP (RADIOLOG
120.0000 mg | Freq: Once | INTRAMUSCULAR | Status: AC
  Administered 2018-09-01: 120 mg via EPIDURAL

## 2018-09-01 MED ORDER — IOPAMIDOL (ISOVUE-M 200) INJECTION 41%
1.0000 mL | Freq: Once | INTRAMUSCULAR | Status: AC
  Administered 2018-09-01: 1 mL via EPIDURAL

## 2018-09-01 NOTE — Discharge Instructions (Signed)

## 2019-03-24 HISTORY — PX: BACK SURGERY: SHX140

## 2019-03-30 ENCOUNTER — Other Ambulatory Visit: Payer: Self-pay

## 2019-03-30 ENCOUNTER — Other Ambulatory Visit (HOSPITAL_COMMUNITY): Payer: Self-pay | Admitting: Pulmonary Disease

## 2019-03-30 ENCOUNTER — Ambulatory Visit (HOSPITAL_COMMUNITY)
Admission: RE | Admit: 2019-03-30 | Discharge: 2019-03-30 | Disposition: A | Discharge status: Home / Self Care | Payer: BLUE CROSS/BLUE SHIELD | Source: Ambulatory Visit | Attending: Pulmonary Disease | Admitting: Pulmonary Disease

## 2019-03-30 ENCOUNTER — Other Ambulatory Visit: Payer: Self-pay | Admitting: Pulmonary Disease

## 2019-03-30 DIAGNOSIS — M79605 Pain in left leg: Secondary | ICD-10-CM | POA: Insufficient documentation

## 2019-09-03 ENCOUNTER — Other Ambulatory Visit: Payer: Self-pay

## 2019-09-03 ENCOUNTER — Ambulatory Visit
Admission: EM | Admit: 2019-09-03 | Discharge: 2019-09-03 | Disposition: A | Discharge status: Home / Self Care | Payer: PRIVATE HEALTH INSURANCE

## 2019-09-03 DIAGNOSIS — Z20822 Contact with and (suspected) exposure to covid-19: Secondary | ICD-10-CM

## 2019-09-03 HISTORY — DX: Supraventricular tachycardia, unspecified: I47.10

## 2019-09-03 HISTORY — DX: Supraventricular tachycardia: I47.1

## 2019-09-03 MED ORDER — FLUTICASONE PROPIONATE 50 MCG/ACT NA SUSP: 1.0000 | Freq: Every day | NASAL | 0 refills | Status: DC

## 2019-09-03 MED ORDER — BENZONATATE 100 MG PO CAPS: 100.0000 mg | ORAL_CAPSULE | Freq: Three times a day (TID) | ORAL | 0 refills | Status: DC

## 2019-09-03 NOTE — ED Triage Notes (Signed)
Pt presents to UC for covid test.  Pt c/o chills, body aches, cough, sore throat 6 days ago. Pt finished taking levaquin 4 days ago for sinus infection. Pt's sister is covid positive.

## 2019-09-03 NOTE — ED Provider Notes (Signed)
[facility]    CSN: [id number] Arrival date & time: 09/03/19  1432      History   Chief Complaint Chief Complaint  Patient presents with  . cough, fatigue, body aches    HPI Tracy Wise is a 49 y.o. female.   Tracy Wise 49 years old female presented to the urgent care with a complaint of chills, cough, body aches for the past 6 days.  Reports she was treated with Levaquin 6 days prior.  Denies sick exposure to COVID, flu or strep.  Denies recent travel.  Denies aggravating or alleviating symptoms.  Denies previous COVID infection.   Denies fever, fatigue, nasal congestion, rhinorrhea, sore throat, SOB, wheezing, chest pain, nausea, vomiting, changes in bowel or bladder habits.    The history is provided by the patient. No language interpreter was used.    Past Medical History:  Diagnosis Date  . SVT (supraventricular tachycardia) (HCC)     There are no problems to display for this patient.   Past Surgical History:  Procedure Laterality Date  . BACK SURGERY  03/24/2019    OB History   No obstetric history on file.      Home Medications    Prior to Admission medications   Medication Sig Start Date End Date Taking? Authorizing Provider  celecoxib (CELEBREX) 200 MG capsule Take 200 mg by mouth daily.   Yes [provider]  meclizine (ANTIVERT) 25 MG tablet Take 25 mg by mouth 3 (three) times daily as needed for dizziness.   Yes [provider]  metoprolol tartrate (LOPRESSOR) 50 MG tablet Take 50 mg by mouth 2 (two) times daily.   Yes [provider]  oxycodone (OXY-IR) 5 MG capsule Take 5 mg by mouth every 6 (six) hours as needed.   Yes [provider]  topiramate (TOPAMAX) 50 MG tablet Take 50 mg by mouth 2 (two) times daily.   Yes [provider]  benzonatate (TESSALON) 100 MG capsule Take 1 capsule (100 mg total) by mouth every 8 (eight) hours. 09/03/19   Liam Cammarata, Zachery Dakins, FNP  cefdinir (OMNICEF) 300  MG capsule Take 1 capsule (300 mg total) by mouth 2 (two) times daily. 03/02/16   Lurene Shadow, PA-C  chlorpheniramine-HYDROcodone (TUSSIONEX PENNKINETIC ER) 10-8 MG/5ML SUER Take 5 mLs by mouth every 12 (twelve) hours as needed for cough. 03/16/16   Linna Hoff, MD  cyclobenzaprine (FLEXERIL) 10 MG tablet Take 10 mg by mouth as needed for muscle spasms.    [provider]  fluticasone (FLONASE) 50 MCG/ACT nasal spray Place 1 spray into both nostrils daily for 14 days. 09/03/19 09/17/19  Ineta Sinning, Zachery Dakins, FNP  hyoscyamine (ANASPAZ) 0.125 MG TBDP Place 0.125 mg under the tongue.    [provider]  ipratropium (ATROVENT) 0.06 % nasal spray Place 2 sprays into both nostrils 4 (four) times daily. 03/16/16   Linna Hoff, MD  meloxicam (MOBIC) 15 MG tablet Take 1 tablet (15 mg total) by mouth daily. 03/18/14   Carmelina Dane, MD  predniSONE (DELTASONE) 20 MG tablet 3 tabs po day one, then 2 po daily x 4 days 03/02/16   Lurene Shadow, PA-C  promethazine (PHENERGAN) 25 MG tablet Take 25 mg by mouth every 6 (six) hours as needed.    [provider]  rizatriptan (MAXALT) 10 MG tablet Take 10 mg by mouth as needed. May repeat in 2 hours if needed    [provider]  Family History Family History  Problem Relation Age of Onset  . Healthy Mother   . Migraines Father     Social History Social History   Tobacco Use  . Smoking status: Never Smoker  . Smokeless tobacco: Never Used  Substance Use Topics  . Alcohol use: No  . Drug use: No     Allergies   Amoxicillin   Review of Systems Review of Systems  Constitutional: Positive for chills.  HENT: Negative.   Respiratory: Positive for cough.   Cardiovascular: Negative.   Gastrointestinal: Negative.   Musculoskeletal:       Body aches  Neurological: Negative.      Physical Exam Triage Vital Signs ED Triage Vitals  Enc Vitals Group     BP 09/03/19 1443 108/76     Pulse Rate 09/03/19 1443  96     Resp 09/03/19 1443 16     Temp 09/03/19 1443 98.9 F (37.2 C)     Temp Source 09/03/19 1443 Oral     SpO2 09/03/19 1443 96 %     Weight --      Height --      Head Circumference --      Peak Flow --      Pain Score 09/03/19 1449 0     Pain Loc --      Pain Edu? --      Excl. in [place]? --    No data found.  Updated Vital Signs BP 108/76 (BP Location: Right Arm)   Pulse 96   Temp 98.9 F (37.2 C) (Oral)   Resp 16   SpO2 96%   Visual Acuity Right Eye Distance:   Left Eye Distance:   Bilateral Distance:    Right Eye Near:   Left Eye Near:    Bilateral Near:     Physical Exam Vitals and nursing note reviewed.  Constitutional:      General: She is not in acute distress.    Appearance: Normal appearance. She is normal weight. She is not ill-appearing or toxic-appearing.  HENT:     Head: Normocephalic.     Right Ear: Tympanic membrane, ear canal and external ear normal. There is no impacted cerumen.     Left Ear: Tympanic membrane, ear canal and external ear normal. There is no impacted cerumen.     Nose: Nose normal. No congestion.     Mouth/Throat:     Mouth: Mucous membranes are moist.     Pharynx: Oropharynx is clear. No oropharyngeal exudate or posterior oropharyngeal erythema.  Cardiovascular:     Rate and Rhythm: Normal rate and regular rhythm.     Pulses: Normal pulses.     Heart sounds: Normal heart sounds. No murmur.  Pulmonary:     Effort: Pulmonary effort is normal. No respiratory distress.     Breath sounds: Normal breath sounds. No wheezing or rhonchi.  Chest:     Chest wall: No tenderness.  Abdominal:     General: Abdomen is flat. Bowel sounds are normal. There is no distension.     Palpations: There is no mass.     Tenderness: There is no abdominal tenderness.  Skin:    Capillary Refill: Capillary refill takes less than 2 seconds.  Neurological:     General: No focal deficit present.     Mental Status: She is alert and oriented to person,  place, and time.      UC Treatments / Results  Labs (all labs ordered are  listed, but only abnormal results are displayed) Labs Reviewed  NOVEL CORONAVIRUS, NAA    EKG   Radiology No results found.  Procedures Procedures (including critical care time)  Medications Ordered in UC Medications - No data to display  Initial Impression / Assessment and Plan / UC Course  I have reviewed the triage vital signs and the nursing notes.  Pertinent labs & imaging results that were available during my care of the patient were reviewed by me and considered in my medical decision making (see chart for details).    COVID-19 test was ordered [employer] prescribed for cough Flonase prescribed Work note given To quarantine Go to ED for worsening of symptoms Patient verbalized understanding of the plan of care  Final Clinical Impressions(s) / UC Diagnoses   Final diagnoses:  Suspected COVID-19 virus infection     Discharge Instructions     COVID testing ordered.  It will take between 2-7 days for test results.  Someone will contact you regarding abnormal results.    In the meantime: You should remain isolated in your home for 10 days from symptom onset AND greater than 72 hours after symptoms resolution (absence of fever without the use of fever-reducing medication and improvement in respiratory symptoms), whichever is longer Get plenty of rest and push fluids Tessalon Perles prescribed for cough Flonase prescribed for nasal congestion and runny nose Use medications daily for symptom relief Use OTC medications like ibuprofen or tylenol as needed fever or pain Call or go to the ED if you have any new or worsening symptoms such as fever, worsening cough, shortness of breath, chest tightness, chest pain, turning blue, changes in mental status, etc...     ED Prescriptions    Medication Sig Dispense Auth. Provider   fluticasone (FLONASE) 50 MCG/ACT nasal spray Place 1 spray  into both nostrils daily for 14 days. 16 g Zameer Borman S, FNP   benzonatate (TESSALON) 100 MG capsule Take 1 capsule (100 mg total) by mouth every 8 (eight) hours. 21 capsule Abeer Deskins, Zachery Dakins, FNP     PDMP not reviewed this encounter.   Durward Parcel, FNP 09/03/19 1519

## 2019-09-03 NOTE — Discharge Instructions (Addendum)
COVID testing ordered.  It will take between 2-7 days for test results.  Someone will contact you regarding abnormal results.    In the meantime: You should remain isolated in your home for 10 days from symptom onset AND greater than 72 hours after symptoms resolution (absence of fever without the use of fever-reducing medication and improvement in respiratory symptoms), whichever is longer Get plenty of rest and push fluids Tessalon Perles prescribed for cough Flonase prescribed for nasal congestion and runny nose Use medications daily for symptom relief Use OTC medications like ibuprofen or tylenol as needed fever or pain Call or go to the ED if you have any new or worsening symptoms such as fever, worsening cough, shortness of breath, chest tightness, chest pain, turning blue, changes in mental status, etc...  

## 2019-09-04 LAB — NOVEL CORONAVIRUS, NAA: SARS-CoV-2, NAA: DETECTED — AB

## 2019-09-05 ENCOUNTER — Telehealth (HOSPITAL_COMMUNITY): Payer: Self-pay | Admitting: Emergency Medicine

## 2019-09-05 NOTE — Telephone Encounter (Signed)

## 2019-10-10 ENCOUNTER — Ambulatory Visit
Admission: EM | Admit: 2019-10-10 | Discharge: 2019-10-10 | Disposition: A | Discharge status: Home / Self Care | Payer: PRIVATE HEALTH INSURANCE | Attending: Emergency Medicine | Admitting: Emergency Medicine

## 2019-10-10 ENCOUNTER — Other Ambulatory Visit: Payer: Self-pay

## 2019-10-10 DIAGNOSIS — J069 Acute upper respiratory infection, unspecified: Secondary | ICD-10-CM | POA: Diagnosis not present

## 2019-10-10 MED ORDER — BENZONATATE 100 MG PO CAPS: 100.0000 mg | ORAL_CAPSULE | Freq: Three times a day (TID) | ORAL | 0 refills | Status: DC

## 2019-10-10 MED ORDER — PREDNISONE 10 MG PO TABS: 20.0000 mg | ORAL_TABLET | Freq: Every day | ORAL | 0 refills | Status: DC

## 2019-10-10 NOTE — ED Triage Notes (Signed)
Pt presents to UC w/ c/o cough, runny nose, stuffy ears x1 week. Pt states persistent dry cough.  Hx ear infections.

## 2019-10-10 NOTE — Discharge Instructions (Addendum)
Advised patient  to use Flonase to relieve  ear pressure Tessalon Perles prescribed for cough Short-term prednisone was prescribed for possible wheezing Follow with primary care Return for worsening of symptoms

## 2019-10-10 NOTE — ED Provider Notes (Addendum)
[facility]    CSN: [id number] Arrival date & time: 10/10/19  1657      History   Chief Complaint Chief Complaint  Patient presents with  . cough, ear stuffiness    HPI Tracy Wise is a 49 y.o. female.   Who presents to the urgent care for complaint of cough, runny nose, wheezing, ear pressure for the past week.  She tested positive for COVID-19 on 09/03/2019.  Was prescribed Flonase.  Denies sick exposure to COVID, flu or strep.  Denies recent travel.  Denies aggravating or alleviating symptoms.  Denies fever, chills, fatigue, nasal congestion,   sore throat, SOB, wheezing, chest pain, nausea, vomiting, changes in bowel or bladder habits.    The history is provided by the patient. No language interpreter was used.    Past Medical History:  Diagnosis Date  . SVT (supraventricular tachycardia) (HCC)     There are no problems to display for this patient.   Past Surgical History:  Procedure Laterality Date  . BACK SURGERY  03/24/2019    OB History   No obstetric history on file.      Home Medications    Prior to Admission medications   Medication Sig Start Date End Date Taking? Authorizing Provider  benzonatate (TESSALON) 100 MG capsule Take 1 capsule (100 mg total) by mouth every 8 (eight) hours. 10/10/19   Jamyson Jirak, Zachery Dakins, FNP  cefdinir (OMNICEF) 300 MG capsule Take 1 capsule (300 mg total) by mouth 2 (two) times daily. 03/02/16   Lurene Shadow, PA-C  celecoxib (CELEBREX) 200 MG capsule Take 200 mg by mouth daily.    [provider]  chlorpheniramine-HYDROcodone (TUSSIONEX PENNKINETIC ER) 10-8 MG/5ML SUER Take 5 mLs by mouth every 12 (twelve) hours as needed for cough. 03/16/16   Linna Hoff, MD  cyclobenzaprine (FLEXERIL) 10 MG tablet Take 10 mg by mouth as needed for muscle spasms.    [provider]  fluticasone (FLONASE) 50 MCG/ACT nasal spray Place 1 spray into both nostrils daily for 14 days. 09/03/19 09/17/19  Loretta Doutt,  Zachery Dakins, FNP  hyoscyamine (ANASPAZ) 0.125 MG TBDP Place 0.125 mg under the tongue.    [provider]  ipratropium (ATROVENT) 0.06 % nasal spray Place 2 sprays into both nostrils 4 (four) times daily. 03/16/16   Linna Hoff, MD  meclizine (ANTIVERT) 25 MG tablet Take 25 mg by mouth 3 (three) times daily as needed for dizziness.    [provider]  meloxicam (MOBIC) 15 MG tablet Take 1 tablet (15 mg total) by mouth daily. 03/18/14   Carmelina Dane, MD  metoprolol tartrate (LOPRESSOR) 50 MG tablet Take 50 mg by mouth 2 (two) times daily.    [provider]  oxycodone (OXY-IR) 5 MG capsule Take 5 mg by mouth every 6 (six) hours as needed.    [provider]  predniSONE (DELTASONE) 10 MG tablet Take 2 tablets (20 mg total) by mouth daily. 10/10/19   Josejulian Tarango, Zachery Dakins, FNP  promethazine (PHENERGAN) 25 MG tablet Take 25 mg by mouth every 6 (six) hours as needed.    [provider]  rizatriptan (MAXALT) 10 MG tablet Take 10 mg by mouth as needed. May repeat in 2 hours if needed    [provider]  topiramate (TOPAMAX) 50 MG tablet Take 50 mg by mouth 2 (two) times daily.    [provider]    Family History Family History  Problem Relation Age of Onset  .  Healthy Mother   . Migraines Father     Social History Social History   Tobacco Use  . Smoking status: Never Smoker  . Smokeless tobacco: Never Used  Substance Use Topics  . Alcohol use: No  . Drug use: No     Allergies   Amoxicillin   Review of Systems Review of Systems  Constitutional: Negative.   HENT: Positive for rhinorrhea.        Ear pressure  Respiratory: Positive for cough.   Cardiovascular: Negative.   Gastrointestinal: Negative.   All other systems reviewed and are negative.    Physical Exam Triage Vital Signs ED Triage Vitals  Enc Vitals Group     BP 10/10/19 1709 105/71     Pulse Rate 10/10/19 1709 66     Resp 10/10/19 1709 16     Temp  10/10/19 1709 98.8 F (37.1 C)     Temp Source 10/10/19 1709 Oral     SpO2 10/10/19 1709 99 %     Weight --      Height --      Head Circumference --      Peak Flow --      Pain Score 10/10/19 1713 0     Pain Loc --      Pain Edu? --      Excl. in GC? --    No data found.  Updated Vital Signs BP 105/71 (BP Location: Right Arm)   Pulse 66   Temp 98.8 F (37.1 C) (Oral)   Resp 16   SpO2 99%   Visual Acuity Right Eye Distance:   Left Eye Distance:   Bilateral Distance:    Right Eye Near:   Left Eye Near:    Bilateral Near:     Physical Exam Vitals and nursing note reviewed.  Constitutional:      General: She is not in acute distress.    Appearance: Normal appearance. She is normal weight. She is not ill-appearing or toxic-appearing.  HENT:     Head: Normocephalic.     Right Ear: Ear canal and external ear normal. There is no impacted cerumen. A PE tube is present.     Left Ear: Tympanic membrane, ear canal and external ear normal. There is no impacted cerumen.     Nose: Nose normal. No congestion.     Mouth/Throat:     Mouth: Mucous membranes are moist.     Pharynx: Oropharynx is clear. No oropharyngeal exudate or posterior oropharyngeal erythema.  Cardiovascular:     Rate and Rhythm: Normal rate and regular rhythm.     Pulses: Normal pulses.     Heart sounds: Normal heart sounds. No murmur.  Pulmonary:     Effort: Pulmonary effort is normal. No respiratory distress.     Breath sounds: Normal breath sounds. No wheezing or rhonchi.  Chest:     Chest wall: No tenderness.  Abdominal:     General: Abdomen is flat. Bowel sounds are normal. There is no distension.     Palpations: There is no mass.     Tenderness: There is no abdominal tenderness.  Skin:    Capillary Refill: Capillary refill takes less than 2 seconds.  Neurological:     General: No focal deficit present.     Mental Status: She is alert and oriented to person, place, and time.      UC  Treatments / Results  Labs (all labs ordered are listed, but only abnormal results are displayed)  Labs Reviewed - No data to display  EKG   Radiology No results found.  Procedures Procedures (including critical care time)  Medications Ordered in UC Medications - No data to display  Initial Impression / Assessment and Plan / UC Course  I have reviewed the triage vital signs and the nursing notes.  Pertinent labs & imaging results that were available during my care of the patient were reviewed by me and considered in my medical decision making (see chart for details).   Patient is stable for discharge.  Advised patient to continue to use Flonase that was prescribed at previous visit Tessalon Perles prescribed Short-term prednisone was prescribed Follow-up with primary care Return for worsening of symptoms  Final Clinical Impressions(s) / UC Diagnoses   Final diagnoses:  Viral URI     Discharge Instructions     Advised patient  to use Flonase to relieve  ear pressure Tessalon Perles prescribed for cough Short-term prednisone was prescribed for possible wheezing Follow with primary care Return for worsening of symptoms    ED Prescriptions    Medication Sig Dispense Auth. Provider   benzonatate (TESSALON) 100 MG capsule Take 1 capsule (100 mg total) by mouth every 8 (eight) hours. 42 capsule Stephine Langbehn S, FNP   predniSONE (DELTASONE) 10 MG tablet Take 2 tablets (20 mg total) by mouth daily. 15 tablet Jaxson Keener, Darrelyn Hillock, FNP     PDMP not reviewed this encounter.   Emerson Monte, FNP 10/10/19 1736    Emerson Monte, FNP 10/10/19 [phone]

## 2020-01-23 ENCOUNTER — Other Ambulatory Visit: Payer: Self-pay

## 2020-01-23 ENCOUNTER — Ambulatory Visit
Admission: EM | Admit: 2020-01-23 | Discharge: 2020-01-23 | Disposition: A | Discharge status: Home / Self Care | Payer: PRIVATE HEALTH INSURANCE | Attending: Emergency Medicine | Admitting: Emergency Medicine

## 2020-01-23 DIAGNOSIS — J22 Unspecified acute lower respiratory infection: Secondary | ICD-10-CM | POA: Diagnosis not present

## 2020-01-23 DIAGNOSIS — R062 Wheezing: Secondary | ICD-10-CM

## 2020-01-23 DIAGNOSIS — J209 Acute bronchitis, unspecified: Secondary | ICD-10-CM

## 2020-01-23 DIAGNOSIS — M2540 Effusion, unspecified joint: Secondary | ICD-10-CM

## 2020-01-23 DIAGNOSIS — Z760 Encounter for issue of repeat prescription: Secondary | ICD-10-CM

## 2020-01-23 MED ORDER — ALBUTEROL SULFATE HFA 108 (90 BASE) MCG/ACT IN AERS
2.0000 | INHALATION_SPRAY | Freq: Once | RESPIRATORY_TRACT | Status: AC
  Administered 2020-01-23: 2 via RESPIRATORY_TRACT

## 2020-01-23 MED ORDER — PREDNISONE 10 MG (21) PO TBPK: ORAL_TABLET | Freq: Every day | ORAL | 0 refills | Status: DC

## 2020-01-23 MED ORDER — HYDROCODONE-HOMATROPINE 5-1.5 MG/5ML PO SYRP: 5.0000 mL | ORAL_SOLUTION | Freq: Every day | ORAL | 0 refills | Status: DC

## 2020-01-23 MED ORDER — AZITHROMYCIN 250 MG PO TABS: 250.0000 mg | ORAL_TABLET | Freq: Every day | ORAL | 0 refills | Status: DC

## 2020-01-23 MED ORDER — CELECOXIB 200 MG PO CAPS: 200.0000 mg | ORAL_CAPSULE | Freq: Every day | ORAL | 0 refills | Status: DC

## 2020-01-23 MED ORDER — BENZONATATE 100 MG PO CAPS: 100.0000 mg | ORAL_CAPSULE | Freq: Three times a day (TID) | ORAL | 0 refills | Status: DC

## 2020-01-23 NOTE — ED Provider Notes (Addendum)
[place]   [id number] 01/23/20 Arrival Time: 1843  Cc: COUGH  SUBJECTIVE:  Tracy Wise is a 49 y.o. female who presents with runny nose, congestion, productive cough, and wheezing x 2-3 weeks.  Denies positive sick exposure or precipitating event.  Describes cough as constant and productive.  Has tried OTC medications without relief.  Symptoms are made worse with at night.  Denies previous symptoms in the past.   Denies fever, chills, sore throat, SOB, chest pain, nausea, changes in bowel or bladder habits.    Also mentions LT pointer finger pain and swelling x 1 month.  Denies trauma or precipitating event.  Has tried OTC medications.  Reports previous symptoms in the past that improved with celebrex.  Requests medication refill.    ROS: As per HPI.  All other pertinent ROS negative.     Past Medical History:  Diagnosis Date   SVT (supraventricular tachycardia) [facility])    Past Surgical History:  Procedure Laterality Date   BACK SURGERY  03/24/2019   Allergies  Allergen Reactions   Amoxicillin     "I get UTIs"   No current facility-administered medications on file prior to encounter.   Current Outpatient Medications on File Prior to Encounter  Medication Sig Dispense Refill   cyclobenzaprine (FLEXERIL) 10 MG tablet Take 10 mg by mouth as needed for muscle spasms.     fluticasone (FLONASE) 50 MCG/ACT nasal spray Place 1 spray into both nostrils daily for 14 days. 16 g 0   hyoscyamine (ANASPAZ) 0.125 MG TBDP Place 0.125 mg under the tongue.     meclizine (ANTIVERT) 25 MG tablet Take 25 mg by mouth 3 (three) times daily as needed for dizziness.     metoprolol tartrate (LOPRESSOR) 50 MG tablet Take 50 mg by mouth 2 (two) times daily.     promethazine (PHENERGAN) 25 MG tablet Take 25 mg by mouth every 6 (six) hours as needed.     rizatriptan (MAXALT) 10 MG tablet Take by mouth.     topiramate (TOPAMAX) 50 MG tablet Take 50 mg by mouth 2 (two) times daily.        Social History   Socioeconomic History   Marital status: Married    Spouse name: Not on file   Number of children: Not on file   Years of education: Not on file   Highest education level: Not on file  Occupational History   Not on file  Tobacco Use   Smoking status: Never Smoker   Smokeless tobacco: Never Used  Substance and Sexual Activity   Alcohol use: No   Drug use: No   Sexual activity: Not Currently  Other Topics Concern   Not on file  Social History Narrative   Not on file   Social Determinants of Health   Financial Resource Strain:    Difficulty of Paying Living Expenses:   Food Insecurity:    Worried About Charity fundraiser in the Last Year:    Arboriculturist in the Last Year:   Transportation Needs:    Film/video editor (Medical):    Lack of Transportation (Non-Medical):   Physical Activity:    Days of Exercise per Week:    Minutes of Exercise per Session:   Stress:    Feeling of Stress :   Social Connections:    Frequency of Communication with Friends and Family:    Frequency of Social Gatherings with Friends and Family:    Attends Religious Services:  Active Member of Clubs or Organizations:    Attends Engineer, structural:    Marital Status:   Intimate Partner Violence:    Fear of Current or Ex-Partner:    Emotionally Abused:    Physically Abused:    Sexually Abused:    Family History  Problem Relation Age of Onset   Healthy Mother    Migraines Father      OBJECTIVE:  Vitals:   01/23/20 1854  BP: 123/78  Pulse: 76  Resp: 17  Temp: 98 F (36.7 C)  TempSrc: Tympanic  SpO2: 98%     General appearance: Alert, appears fatigued, but nontoxic; speaking in full sentences without difficulty HEENT:NCAT; Ears: EACs clear, TMs pearly gray; Eyes: PERRL.  EOM grossly intact. Nose: nares patent without rhinorrhea; Throat: tonsils nonerythematous or enlarged, uvula midline  Neck: supple  without LAD Lungs: Wheezes heard over RT lung field, decreased breath sounds over LT lung field; normal respiratory effort; moderate cough present Heart: regular rate and rhythm.   Skin: warm and dry Psychological: alert and cooperative; normal mood and affect  ASSESSMENT & PLAN:  1. Lower respiratory tract infection   2. Acute bronchitis, unspecified organism   3. Wheezing   4. Medication refill   5. Painful swelling of joint     Meds ordered this encounter  Medications   benzonatate (TESSALON) 100 MG capsule    Sig: Take 1 capsule (100 mg total) by mouth every 8 (eight) hours.    Dispense:  21 capsule    Refill:  0    Order Specific Question:   Supervising Provider    Answer:   Eustace Moore [[id number]]   predniSONE (STERAPRED UNI-PAK 21 TAB) 10 MG (21) TBPK tablet    Sig: Take by mouth daily. Take 6 tabs by mouth daily  for 2 days, then 5 tabs for 2 days, then 4 tabs for 2 days, then 3 tabs for 2 days, 2 tabs for 2 days, then 1 tab by mouth daily for 2 days    Dispense:  42 tablet    Refill:  0    Order Specific Question:   Supervising Provider    Answer:   Eustace Moore [[id number]]   azithromycin (ZITHROMAX) 250 MG tablet    Sig: Take 1 tablet (250 mg total) by mouth daily. Take first 2 tablets together, then 1 every day until finished.    Dispense:  6 tablet    Refill:  0    Order Specific Question:   Supervising Provider    Answer:   Eustace Moore [[id number]]   HYDROcodone-homatropine (HYCODAN) 5-1.5 MG/5ML syrup    Sig: Take 5 mLs by mouth at bedtime.    Dispense:  25 mL    Refill:  0    Order Specific Question:   Supervising Provider    Answer:   Eustace Moore [[id number]]   celecoxib (CELEBREX) 200 MG capsule    Sig: Take 1 capsule (200 mg total) by mouth daily.    Dispense:  30 capsule    Refill:  0    Order Specific Question:   Supervising Provider    Answer:   Eustace Moore [[id number]]   albuterol (VENTOLIN HFA) 108 (90 Base) MCG/ACT  inhaler 2 puff   Get plenty of rest and push fluids Albuterol inhaler given.  Use as needed for shortness of breath and wheezing Prednisone prescribed.  Take as directed and to completion Azithromycin prescribed.  Take as directed and  to completion Prescribed tessolone perles as needed for cough Cough syrup prescribed.  Use at bedtime to help with cough.   Follow up with PCP for recheck and/or if symptoms persists Return or go to ER if you have any new or worsening symptoms such as fever, chills, fatigue, shortness of breath, wheezing, chest pain, nausea, changes in bowel or bladder habits, etc...  Celebrex refilled for joint pain and swelling  Reviewed expectations re: course of current medical issues. Questions answered. Outlined signs and symptoms indicating need for more acute intervention. Patient verbalized understanding. After Visit Summary given.     Rennis Harding, PA-C 01/23/20 1909

## 2020-01-23 NOTE — Discharge Instructions (Signed)
Get plenty of rest and push fluids Albuterol inhaler given.  Use as needed for shortness of breath and wheezing Prednisone prescribed.  Take as directed and to completion Azithromycin prescribed.  Take as directed and to completion Prescribed tessolone perles as needed for cough Cough syrup prescribed.  Use at bedtime to help with cough.   Follow up with PCP for recheck and/or if symptoms persists Return or go to ER if you have any new or worsening symptoms such as fever, chills, fatigue, shortness of breath, wheezing, chest pain, nausea, changes in bowel or bladder habits, etc...  Celebrex refilled for joint pain and swelling

## 2020-01-23 NOTE — ED Triage Notes (Signed)
Productive cough x 2-3 weeks, afebrile

## 2020-03-03 ENCOUNTER — Encounter: Payer: Self-pay | Admitting: Emergency Medicine

## 2020-03-03 ENCOUNTER — Ambulatory Visit
Admission: EM | Admit: 2020-03-03 | Discharge: 2020-03-03 | Disposition: A | Discharge status: Home / Self Care | Payer: PRIVATE HEALTH INSURANCE | Attending: Emergency Medicine | Admitting: Emergency Medicine

## 2020-03-03 DIAGNOSIS — M2540 Effusion, unspecified joint: Secondary | ICD-10-CM

## 2020-03-03 MED ORDER — CELECOXIB 200 MG PO CAPS: 200.0000 mg | ORAL_CAPSULE | Freq: Two times a day (BID) | ORAL | 0 refills | Status: DC

## 2020-03-03 MED ORDER — PREDNISONE 10 MG PO TABS: 20.0000 mg | ORAL_TABLET | Freq: Every day | ORAL | 0 refills | Status: DC

## 2020-03-03 NOTE — Discharge Instructions (Signed)
Take Celebrex as prescribed/use as directed and with food Do not use any other NSAID medications as it will increased your  risk of GI bleed Prednisone was prescribed use as directed Follow-up with PCP Return or go to ED for worsening of symptoms

## 2020-03-03 NOTE — ED Triage Notes (Signed)
Swelling pain to LT middle finger x 70month,  Seen here for the same to LT index finger and put on celebrex.

## 2020-03-03 NOTE — ED Provider Notes (Signed)
[facility]   [id number] 03/03/20 Arrival Time: 1240   Chief Complaint  Patient presents with  . Hand Pain     SUBJECTIVE: History from: patient.  Tracy Wise is a 49 y.o. female who presented to the urgent care for complaint of left middle finger pain and swelling for more than a month.  Was seen at the urgent care previously and was prescribed Celebrex with relief.  Reports worsening symptoms as she start working and is now typing all the time at work.  She localized the pain to the  left middle finger.  Described the pain as constant achy.  She has tried OTC medications without relief.  Her symptoms are made worse with ROM.  She denies similar symptoms in the past.  Denies chills, fever, nausea, vomiting, diarrhea.  ROS: As per HPI.  All other pertinent ROS negative.      Past Medical History:  Diagnosis Date  . SVT (supraventricular tachycardia) (HCC)    Past Surgical History:  Procedure Laterality Date  . BACK SURGERY  03/24/2019   Allergies  Allergen Reactions  . Amoxicillin     "I get UTIs"   No current facility-administered medications on file prior to encounter.   Current Outpatient Medications on File Prior to Encounter  Medication Sig Dispense Refill  . azithromycin (ZITHROMAX) 250 MG tablet Take 1 tablet (250 mg total) by mouth daily. Take first 2 tablets together, then 1 every day until finished. 6 tablet 0  . benzonatate (TESSALON) 100 MG capsule Take 1 capsule (100 mg total) by mouth every 8 (eight) hours. 21 capsule 0  . cyclobenzaprine (FLEXERIL) 10 MG tablet Take 10 mg by mouth as needed for muscle spasms.    . fluticasone (FLONASE) 50 MCG/ACT nasal spray Place 1 spray into both nostrils daily for 14 days. 16 g 0  . HYDROcodone-homatropine (HYCODAN) 5-1.5 MG/5ML syrup Take 5 mLs by mouth at bedtime. 25 mL 0  . hyoscyamine (ANASPAZ) 0.125 MG TBDP Place 0.125 mg under the tongue.    . meclizine (ANTIVERT) 25 MG tablet Take 25 mg by mouth 3  (three) times daily as needed for dizziness.    . metoprolol tartrate (LOPRESSOR) 50 MG tablet Take 50 mg by mouth 2 (two) times daily.    . promethazine (PHENERGAN) 25 MG tablet Take 25 mg by mouth every 6 (six) hours as needed.    . rizatriptan (MAXALT) 10 MG tablet Take by mouth.    . topiramate (TOPAMAX) 50 MG tablet Take 50 mg by mouth 2 (two) times daily.     Social History   Socioeconomic History  . Marital status: Married    Spouse name: Not on file  . Number of children: Not on file  . Years of education: Not on file  . Highest education level: Not on file  Occupational History  . Not on file  Tobacco Use  . Smoking status: Never Smoker  . Smokeless tobacco: Never Used  Substance and Sexual Activity  . Alcohol use: No  . Drug use: No  . Sexual activity: Not Currently  Other Topics Concern  . Not on file  Social History Narrative  . Not on file   Social Determinants of Health   Financial Resource Strain:   . Difficulty of Paying Living Expenses:   Food Insecurity:   . Worried About Programme researcher, broadcasting/film/video in the Last Year:   . Barista in the Last Year:   Transportation Needs:   .  Lack of Transportation (Medical):   Marland Kitchen Lack of Transportation (Non-Medical):   Physical Activity:   . Days of Exercise per Week:   . Minutes of Exercise per Session:   Stress:   . Feeling of Stress :   Social Connections:   . Frequency of Communication with Friends and Family:   . Frequency of Social Gatherings with Friends and Family:   . Attends Religious Services:   . Active Member of Clubs or Organizations:   . Attends Banker Meetings:   Marland Kitchen Marital Status:   Intimate Partner Violence:   . Fear of Current or Ex-Partner:   . Emotionally Abused:   Marland Kitchen Physically Abused:   . Sexually Abused:    Family History  Problem Relation Age of Onset  . Healthy Mother   . Migraines Father     OBJECTIVE:  Vitals:   03/03/20 1324 03/03/20 1325  BP:  112/78  Pulse:   70  Resp:  18  Temp:  98.5 F (36.9 C)  TempSrc:  Oral  SpO2:  98%  Weight: 189 lb 9.5 oz (86 kg)   Height: 5\' 5"  (1.651 m)      Physical Exam Vitals and nursing note reviewed.  Constitutional:      General: She is not in acute distress.    Appearance: Normal appearance. She is normal weight. She is not ill-appearing, toxic-appearing or diaphoretic.  HENT:     Head: Normocephalic.  Cardiovascular:     Rate and Rhythm: Normal rate and regular rhythm.     Pulses: Normal pulses.     Heart sounds: Normal heart sounds. No murmur heard.  No friction rub. No gallop.   Pulmonary:     Effort: Pulmonary effort is normal. No respiratory distress.     Breath sounds: Normal breath sounds. No stridor. No wheezing, rhonchi or rales.  Chest:     Chest wall: No tenderness.  Musculoskeletal:     Right hand: Normal.     Left hand: Tenderness present.     Comments: The left pain is without obvious asymmetry compared to the right hand.  No swelling, erythema, atrophy, obvious deformity, surface trauma, open wound, nail avulsion, tissue avulsion, partial complete amputation, subungual hematoma.  Normal cascade of finger limited range of motion.  Neurovascular status intact.  Neurological:     Mental Status: She is alert and oriented to person, place, and time.    LABS:  No results found for this or any previous visit (from the past 24 hour(s)).   ASSESSMENT & PLAN:  1. Painful swelling of joint     Meds ordered this encounter  Medications  . predniSONE (DELTASONE) 10 MG tablet    Sig: Take 2 tablets (20 mg total) by mouth daily.    Dispense:  15 tablet    Refill:  0  . celecoxib (CELEBREX) 200 MG capsule    Sig: Take 1 capsule (200 mg total) by mouth 2 (two) times daily.    Dispense:  30 capsule    Refill:  0    Discharge instructions Take Celebrex as prescribed/use as directed and with food Do not use any other NSAID medications as it will increased your  risk of GI  bleed Prednisone was prescribed use as directed Follow-up with PCP Return or go to ED for worsening of symptoms    Reviewed expectations re: course of current medical issues. Questions answered. Outlined signs and symptoms indicating need for more acute intervention. Patient verbalized understanding. After  Visit Summary given.     Note: This document was prepared using Dragon voice recognition software and may include unintentional dictation errors.       Note: This document was prepared using Dragon voice recognition software and may include unintentional dictation errors.    Durward Parcel, FNP 03/03/20 1435

## 2020-05-16 ENCOUNTER — Encounter: Payer: Self-pay | Admitting: Family Medicine

## 2020-05-16 ENCOUNTER — Ambulatory Visit (INDEPENDENT_AMBULATORY_CARE_PROVIDER_SITE_OTHER): Payer: PRIVATE HEALTH INSURANCE | Admitting: Family Medicine

## 2020-05-16 ENCOUNTER — Other Ambulatory Visit: Payer: Self-pay

## 2020-05-16 VITALS — BP 109/71 | HR 67 | Temp 97.4°F | Ht 65.0 in | Wt 245.2 lb

## 2020-05-16 DIAGNOSIS — M26629 Arthralgia of temporomandibular joint, unspecified side: Secondary | ICD-10-CM | POA: Insufficient documentation

## 2020-05-16 DIAGNOSIS — R062 Wheezing: Secondary | ICD-10-CM

## 2020-05-16 DIAGNOSIS — K589 Irritable bowel syndrome without diarrhea: Secondary | ICD-10-CM

## 2020-05-16 DIAGNOSIS — M545 Low back pain, unspecified: Secondary | ICD-10-CM | POA: Insufficient documentation

## 2020-05-16 DIAGNOSIS — H6983 Other specified disorders of Eustachian tube, bilateral: Secondary | ICD-10-CM

## 2020-05-16 DIAGNOSIS — I471 Supraventricular tachycardia: Secondary | ICD-10-CM | POA: Diagnosis not present

## 2020-05-16 DIAGNOSIS — M1990 Unspecified osteoarthritis, unspecified site: Secondary | ICD-10-CM | POA: Insufficient documentation

## 2020-05-16 DIAGNOSIS — G43809 Other migraine, not intractable, without status migrainosus: Secondary | ICD-10-CM

## 2020-05-16 DIAGNOSIS — R600 Localized edema: Secondary | ICD-10-CM

## 2020-05-16 DIAGNOSIS — G43909 Migraine, unspecified, not intractable, without status migrainosus: Secondary | ICD-10-CM | POA: Insufficient documentation

## 2020-05-16 DIAGNOSIS — G8929 Other chronic pain: Secondary | ICD-10-CM

## 2020-05-16 MED ORDER — RIZATRIPTAN BENZOATE 10 MG PO TABS: 10.0000 mg | ORAL_TABLET | ORAL | 5 refills | Status: DC | PRN

## 2020-05-16 MED ORDER — CELECOXIB 200 MG PO CAPS: 200.0000 mg | ORAL_CAPSULE | Freq: Two times a day (BID) | ORAL | 5 refills | Status: DC

## 2020-05-16 MED ORDER — PROMETHAZINE HCL 25 MG PO TABS: 25.0000 mg | ORAL_TABLET | Freq: Four times a day (QID) | ORAL | 5 refills | Status: DC | PRN

## 2020-05-16 MED ORDER — CYCLOBENZAPRINE HCL 10 MG PO TABS: 10.0000 mg | ORAL_TABLET | ORAL | 2 refills | Status: DC | PRN

## 2020-05-16 MED ORDER — METHYLPREDNISOLONE ACETATE 80 MG/ML IJ SUSP
80.0000 mg | Freq: Once | INTRAMUSCULAR | Status: AC
  Administered 2020-05-16: 80 mg via INTRAMUSCULAR

## 2020-05-16 MED ORDER — HYOSCYAMINE SULFATE 0.125 MG PO TBDP: 0.1250 mg | ORAL_TABLET | Freq: Four times a day (QID) | ORAL | 5 refills | Status: DC | PRN

## 2020-05-16 NOTE — Assessment & Plan Note (Signed)
Should hopefully have some improvement with Depo-Medrol injection though advised her to follow-up with ENT soon.  She will be going on a flight in about 2 weeks and may need repeat Depo-Medrol injection in about 2 weeks depending on her response to the 1 today.

## 2020-05-16 NOTE — Progress Notes (Signed)
Tracy Wise is a 49 y.o. female who presents today for an office visit.  Assessment/Plan:  Chronic Problems Addressed Today: Migraine Overall stable.  Will refill Phenergan and Maxalt.  IBS (irritable bowel syndrome) Stable.  Will refill hyoscyamine  Leg edema Likely due to venous insufficiency.  No red flag signs or symptoms.  Discussed conservative measures including compression stockings, salt avoidance, and leg elevation.  Wheezing Normal exam today.  Has intermittent wheezing which could be sequela of recent COVID-19 infection.  Discussed further work-up including imaging and/or PFTs however deferred for the time being given that symptoms are currently manageable.  If symptoms do not continue to progress or if they worsen will likely need referral to pulmonology or chest x-ray.  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  Likely muscular strain.  No red flags today.  Will give 80 mg of IM Depo-Medrol.  Will restart Celebrex 200 mg twice daily and Flexeril 10 mg 3 times daily as needed.  TMJ arthralgia Stable currently.  Will refill Flexeril and Celebrex today.  Osteoarthritis Will refill Celebrex.  Has some arthritis in her hand as well.  Recommended over-the-counter Voltaren gel.  Dysfunction of both eustachian tubes Should hopefully have some improvement with Depo-Medrol injection though advised her to follow-up with ENT soon.  She will be going on a flight in about 2 weeks and may need repeat Depo-Medrol injection in about 2 weeks depending on her response to the 1 today.  SVT (supraventricular tachycardia) (HCC) Stable.  Follows with cardiology.  Continue metoprolol 50 mg twice daily per cardiology.     Subjective:  HPI:  Patient is here as a new patient.  She has several issues that she would like to discuss today.  She has been having issues with intermittent wheezing for the past 8 to 9 months.  She was diagnosed with  Covid 9 months ago.  Since then has had intermittent wheezing.  Has seen other physicians for this.  She currently uses albuterol as needed.  No current shortness of breath.  Has occasional dyspnea on exertion which she thinks is about at her baseline.  No fevers or chills.  Albuterol seems to help.  She also history of chronic low back pain.  Underwent surgery last year.  She reinjured it about a week ago.  She is out of her NSAID and Flexeril which usually help.  She also has standing history of eustachian tube dysfunction.  She follows with ENT for this.  She has had tympanostomy tubes placed in the past but these fell out recently.  Has had worsening right-sided ear pain and pressure.  She also is concerned about bilateral lower extremity swelling.  This is been a chronic and progressive issue.  She has bought some compression stockings but has not yet been able to try them.   See A/P for status of other chronic conditions.  PMH:  The following were reviewed and entered/updated in epic: Past Medical History:  Diagnosis Date  . SVT (supraventricular tachycardia) [facility])    Patient Active Problem List   Diagnosis Date Noted  . Dysfunction of both eustachian tubes 05/16/2020  . Osteoarthritis 05/16/2020  . TMJ arthralgia 05/16/2020  . 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 Wheezing 05/16/2020  . Leg edema 05/16/2020  . IBS (irritable bowel syndrome) 05/16/2020  . Migraine 05/16/2020  . SVT (supraventricular tachycardia) (HCC) 05/15/2016   Past Surgical  History:  Procedure Laterality Date  . BACK SURGERY  03/24/2019  . LAPAROSCOPIC TOTAL HYSTERECTOMY      Family History  Problem Relation Age of Onset  . Healthy Mother   . Asthma Mother   . Heart disease Father   . Cancer Father     Medications- reviewed and updated Current Outpatient Medications  Medication Sig Dispense Refill  . celecoxib (CELEBREX) 200 MG capsule Take 1  capsule (200 mg total) by mouth 2 (two) times daily. 60 capsule 5  . cyclobenzaprine (FLEXERIL) 10 MG tablet Take 1 tablet (10 mg total) by mouth as needed for muscle spasms. 30 tablet 2  . metoprolol tartrate (LOPRESSOR) 50 MG tablet Take 50 mg by mouth 2 (two) times daily.    . promethazine (PHENERGAN) 25 MG tablet Take 1 tablet (25 mg total) by mouth every 6 (six) hours as needed. 30 tablet 5  . rizatriptan (MAXALT) 10 MG tablet Take 1 tablet (10 mg total) by mouth as needed for migraine. 10 tablet 5  . fluticasone (FLONASE) 50 MCG/ACT nasal spray Place 1 spray into both nostrils daily for 14 days. 16 g 0  . hyoscyamine (ANASPAZ) 0.125 MG TBDP disintergrating tablet Place 1 tablet (0.125 mg total) under the tongue every 6 (six) hours as needed. 120 tablet 5   No current facility-administered medications for this visit.    Allergies-reviewed and updated Allergies  Allergen Reactions  . Amoxicillin     "I get UTIs"    Social History   Socioeconomic History  . Marital status: Married    Spouse name: Not on file  . Number of children: Not on file  . Years of education: Not on file  . Highest education level: Not on file  Occupational History  . Not on file  Tobacco Use  . Smoking status: Never Smoker  . Smokeless tobacco: Never Used  Substance and Sexual Activity  . Alcohol use: No  . Drug use: No  . Sexual activity: Not Currently  Other Topics Concern  . Not on file  Social History Narrative  . Not on file   Social Determinants of Health   Financial Resource Strain:   . Difficulty of Paying Living Expenses: Not on file  Food Insecurity:   . Worried About Programme researcher, broadcasting/film/video in the Last Year: Not on file  . Ran Out of Food in the Last Year: Not on file  Transportation Needs:   . Lack of Transportation (Medical): Not on file  . Lack of Transportation (Non-Medical): Not on file  Physical Activity:   . Days of Exercise per Week: Not on file  . Minutes of Exercise per  Session: Not on file  Stress:   . Feeling of Stress : Not on file  Social Connections:   . Frequency of Communication with Friends and Family: Not on file  . Frequency of Social Gatherings with Friends and Family: Not on file  . Attends Religious Services: Not on file  . Active Member of Clubs or Organizations: Not on file  . Attends Banker Meetings: Not on file  . Marital Status: Not on file          Objective:  Physical Exam: BP 109/71   Pulse 67   Temp (!) 97.4 F (36.3 C) (Temporal)   Ht 5\' 5"  (1.651 m)   Wt 245 lb 3.2 oz (111.2 kg)   SpO2 98%   BMI 40.80 kg/m   Gen: No acute distress, resting comfortably  HEENT: Right TM with effusion.  Left TM clear CV: Regular rate and rhythm with no murmurs appreciated Pulm: Normal work of breathing, clear to auscultation bilaterally with no crackles, wheezes, or rhonchi MSK: 1+ pitting edema to knees bilaterally.  Tender to palpation across bilateral lower lumbar paraspinal muscles. Neuro: Grossly normal, moves all extremities Psych: Normal affect and thought content  Time Spent: 66 minutes of total time was spent on the date of the encounter performing the following actions: chart review prior to seeing the patient including previous ENT visits, obtaining history, performing a medically necessary exam, counseling on the treatment plan, placing orders, and documenting in our EHR.        Katina Degree. Jimmey Ralph, MD 05/16/2020 11:59 AM

## 2020-05-16 NOTE — Assessment & Plan Note (Signed)
Will refill Celebrex.  Has some arthritis in her hand as well.  Recommended over-the-counter Voltaren gel.

## 2020-05-16 NOTE — Assessment & Plan Note (Signed)
Stable currently.  Will refill Flexeril and Celebrex today.

## 2020-05-16 NOTE — Assessment & Plan Note (Signed)
With flare.  Likely muscular strain.  No red flags today.  Will give 80 mg of IM Depo-Medrol.  Will restart Celebrex 200 mg twice daily and Flexeril 10 mg 3 times daily as needed.

## 2020-05-16 NOTE — Assessment & Plan Note (Signed)
Normal exam today.  Has intermittent wheezing which could be sequela of recent COVID-19 infection.  Discussed further work-up including imaging and/or PFTs however deferred for the time being given that symptoms are currently manageable.  If symptoms do not continue to progress or if they worsen will likely need referral to pulmonology or chest x-ray.

## 2020-05-16 NOTE — Patient Instructions (Signed)
It was very nice to see you today!  We will  Refill your medications today.  We will give you a steroid shot today.  This should help with your ear pressure, back pain, and hand pain.  Please try using heating pad to your back.  You can also try using Voltaren gel for your hand.  Please keep an eye on your wheezing let me know if it worsens over the next few months.  Please keep your legs elevated and avoid salt.  I would like to see you back in year for an annual checkup.  Please come  Take care, Dr Jimmey Ralph  Please try these tips to maintain a healthy lifestyle:   Eat at least 3 REAL meals and 1-2 snacks per day.  Aim for no more than 5 hours between eating.  If you eat breakfast, please do so within one hour of getting up.    Each meal should contain half fruits/vegetables, one quarter protein, and one quarter carbs (no bigger than a computer mouse)   Cut down on sweet beverages. This includes juice, soda, and sweet tea.     Drink at least 1 glass of water with each meal and aim for at least 8 glasses per day   Exercise at least 150 minutes every week.

## 2020-05-16 NOTE — Assessment & Plan Note (Signed)
Stable.  Will refill hyoscyamine

## 2020-05-16 NOTE — Assessment & Plan Note (Signed)
Stable.  Follows with cardiology.  Continue metoprolol 50 mg twice daily per cardiology.

## 2020-05-16 NOTE — Assessment & Plan Note (Signed)
Likely due to venous insufficiency.  No red flag signs or symptoms.  Discussed conservative measures including compression stockings, salt avoidance, and leg elevation.

## 2020-05-16 NOTE — Assessment & Plan Note (Signed)
Overall stable.  Will refill Phenergan and Maxalt.

## 2020-05-28 ENCOUNTER — Telehealth: Payer: Self-pay

## 2020-05-28 NOTE — Telephone Encounter (Signed)
Yes that is ok for her to come in for an 80mg  depomedrol injection - can be a nurse visit.  . Katina Degree, MD 05/28/2020 12:50 PM

## 2020-05-28 NOTE — Telephone Encounter (Signed)
See below

## 2020-05-28 NOTE — Telephone Encounter (Signed)
Called and lm for pt tcb. Please schedule pt depo when she calls back.

## 2020-05-28 NOTE — Telephone Encounter (Signed)
Pt scheduled for 05/29/20.

## 2020-05-28 NOTE — Telephone Encounter (Signed)
Patient is calling in stating she had a cortisone shot last week, and was told if she was unable to get in to see her ENT that Dr.Parker would give her another one. No available appointments until 10/28.

## 2020-05-29 ENCOUNTER — Ambulatory Visit (INDEPENDENT_AMBULATORY_CARE_PROVIDER_SITE_OTHER): Payer: PRIVATE HEALTH INSURANCE | Admitting: *Deleted

## 2020-05-29 ENCOUNTER — Other Ambulatory Visit: Payer: Self-pay

## 2020-05-29 DIAGNOSIS — M1990 Unspecified osteoarthritis, unspecified site: Secondary | ICD-10-CM | POA: Diagnosis not present

## 2020-05-29 MED ORDER — METHYLPREDNISOLONE ACETATE 80 MG/ML IJ SUSP
80.0000 mg | Freq: Once | INTRAMUSCULAR | Status: AC
  Administered 2020-05-29: 80 mg via INTRAMUSCULAR

## 2020-05-29 NOTE — Progress Notes (Signed)
Patient present for Depo-Medrol Inj Inj given on left Ventrogluteal. Pt tolerated well

## 2020-08-01 DIAGNOSIS — Z9622 Myringotomy tube(s) status: Secondary | ICD-10-CM | POA: Insufficient documentation

## 2020-08-12 ENCOUNTER — Other Ambulatory Visit: Payer: Self-pay | Admitting: Family Medicine

## 2020-09-08 ENCOUNTER — Other Ambulatory Visit: Payer: Self-pay | Admitting: Family Medicine

## 2020-12-04 ENCOUNTER — Encounter: Payer: Self-pay | Admitting: Emergency Medicine

## 2020-12-04 ENCOUNTER — Ambulatory Visit
Admission: EM | Admit: 2020-12-04 | Discharge: 2020-12-04 | Disposition: A | Discharge status: Home / Self Care | Payer: PRIVATE HEALTH INSURANCE | Attending: Emergency Medicine | Admitting: Emergency Medicine

## 2020-12-04 ENCOUNTER — Other Ambulatory Visit: Payer: Self-pay

## 2020-12-04 DIAGNOSIS — M25561 Pain in right knee: Secondary | ICD-10-CM

## 2020-12-04 DIAGNOSIS — M25562 Pain in left knee: Secondary | ICD-10-CM

## 2020-12-04 DIAGNOSIS — W19XXXA Unspecified fall, initial encounter: Secondary | ICD-10-CM

## 2020-12-04 DIAGNOSIS — S80212A Abrasion, left knee, initial encounter: Secondary | ICD-10-CM

## 2020-12-04 HISTORY — DX: Dislocation of jaw, unspecified side, initial encounter: S03.00XA

## 2020-12-04 HISTORY — DX: Irritable bowel syndrome, unspecified: K58.9

## 2020-12-04 HISTORY — DX: Otitis media, unspecified, unspecified ear: H66.90

## 2020-12-04 HISTORY — DX: Migraine, unspecified, not intractable, without status migrainosus: G43.909

## 2020-12-04 MED ORDER — MELOXICAM 15 MG PO TABS: 15.0000 mg | ORAL_TABLET | Freq: Every day | ORAL | 0 refills | Status: DC

## 2020-12-04 MED ORDER — MUPIROCIN 2 % EX OINT: 1.0000 "application " | TOPICAL_OINTMENT | Freq: Two times a day (BID) | CUTANEOUS | 0 refills | Status: DC

## 2020-12-04 NOTE — Discharge Instructions (Signed)
Continue conservative management of rest, ice, and elevation Given reassuring exam (patient ambulatory) will hold off on x-rays today Continue to alternate ibuprofen and tylenol mobic for severe break-through pain.  Do not take with other antiinflammatories as this may cause gi upset Bactroban for wound to LT knee to prevent infection Follow up with PCP or orthopedist for further evaluation and managment Return or go to the ER if you have any new or worsening symptoms (fever, chills, chest pain, redness, swelling, bruising, deformity, difficulty walking, etc...)

## 2020-12-04 NOTE — ED Provider Notes (Signed)
[facility]   [id number] 12/04/20 Arrival Time: 1059  CC: Bilateral knee pain  SUBJECTIVE: History from: patient. Tracy Wise is a 50 y.o. female complains of bilateral knee pain that occurred last night.  Fall onto knees in dark parking lot, tripped over handicap ramp that was not properly labeled per patient.  Localizes the pain to the front of knees.  Describes the pain as constant, throbbing and achy in character.  Has tried OTC medications with relief.  Symptoms are made worse to the touch.  Reports similar symptoms in the past when she had another fall a parking lot on her knees.  Complains of associated swelling, redness.  Denies fever, chills, ecchymosis, weakness, numbness and tingling.    ROS: As per HPI.  All other pertinent ROS negative.     Past Medical History:  Diagnosis Date  . Ear infection   . IBS (irritable bowel syndrome)   . Migraine   . SVT (supraventricular tachycardia) (HCC)   . TMJ (dislocation of temporomandibular joint)    Past Surgical History:  Procedure Laterality Date  . BACK SURGERY  03/24/2019  . LAPAROSCOPIC TOTAL HYSTERECTOMY    . TYMPANOSTOMY TUBE PLACEMENT Right    Allergies  Allergen Reactions  . Amoxicillin     "I get UTIs"   No current facility-administered medications on file prior to encounter.   Current Outpatient Medications on File Prior to Encounter  Medication Sig Dispense Refill  . cyclobenzaprine (FLEXERIL) 10 MG tablet TAKE 1 TABLET (10 MG TOTAL) BY MOUTH AS NEEDED FOR MUSCLE SPASMS. 30 tablet 2  . fluticasone (FLONASE) 50 MCG/ACT nasal spray Place 1 spray into both nostrils daily for 14 days. 16 g 0  . hyoscyamine (ANASPAZ) 0.125 MG TBDP disintergrating tablet PLACE 1 TABLET (0.125 MG TOTAL) UNDER THE TONGUE EVERY 6 (SIX) HOURS AS NEEDED. 360 tablet 1  . metoprolol tartrate (LOPRESSOR) 50 MG tablet Take 50 mg by mouth 2 (two) times daily.    . promethazine (PHENERGAN) 25 MG tablet Take 1 tablet (25 mg total) by  mouth every 6 (six) hours as needed. 30 tablet 5  . rizatriptan (MAXALT) 10 MG tablet Take 1 tablet (10 mg total) by mouth as needed for migraine. 10 tablet 5   Social History   Socioeconomic History  . Marital status: Married    Spouse name: Not on file  . Number of children: Not on file  . Years of education: Not on file  . Highest education level: Not on file  Occupational History  . Not on file  Tobacco Use  . Smoking status: Never Smoker  . Smokeless tobacco: Never Used  Substance and Sexual Activity  . Alcohol use: No  . Drug use: No  . Sexual activity: Not Currently  Other Topics Concern  . Not on file  Social History Narrative  . Not on file   Social Determinants of Health   Financial Resource Strain: Not on file  Food Insecurity: Not on file  Transportation Needs: Not on file  Physical Activity: Not on file  Stress: Not on file  Social Connections: Not on file  Intimate Partner Violence: Not on file   Family History  Problem Relation Age of Onset  . Healthy Mother   . Asthma Mother   . Heart disease Father   . Cancer Father     OBJECTIVE:  Vitals:   12/04/20 1114  BP: 120/82  Pulse: 65  Resp: 16  Temp: 98.7 F (37.1 C)  TempSrc: Oral  SpO2: 92%    General appearance: ALERT; in no acute distress.  Head: NCAT Lungs: Normal respiratory effort Musculoskeletal: bilateral knee Inspection: Superficial abrasion to LT anterior knee Palpation: TTP over anterior aspects of bilateral knees; screams and pushes my hand away with palpation ROM: FROM active and passive Strength:  5/5 knee flexion, 5/5 knee extension Skin: warm and dry Neurologic: Ambulates without difficulty; Sensation intact about the lower extremities Psychological: alert and cooperative; normal mood and affect  ASSESSMENT & PLAN:  1. Acute pain of both knees   2. Abrasion, left knee, initial encounter   3. Fall, initial encounter     Meds ordered this encounter  Medications  .  meloxicam (MOBIC) 15 MG tablet    Sig: Take 1 tablet (15 mg total) by mouth daily.    Dispense:  30 tablet    Refill:  0    Order Specific Question:   Supervising Provider    Answer:   Eustace Moore [[id number]]  . mupirocin ointment (BACTROBAN) 2 %    Sig: Apply 1 application topically 2 (two) times daily.    Dispense:  22 g    Refill:  0    Order Specific Question:   Supervising Provider    Answer:   Eustace Moore [[id number]]    Continue conservative management of rest, ice, and elevation Given reassuring exam (patient ambulatory) will hold off on x-rays today Continue to alternate ibuprofen and tylenol Mobic for severe break-through pain.  Do not take with other antiinflammatories as this may cause gi upset Bactroban for wound to LT knee to prevent infection Follow up with PCP or orthopedist for further evaluation and managment Return or go to the ER if you have any new or worsening symptoms (fever, chills, chest pain, redness, swelling, bruising, deformity, difficulty walking, etc...)   Reviewed expectations re: course of current medical issues. Questions answered. Outlined signs and symptoms indicating need for more acute intervention. Patient verbalized understanding. After Visit Summary given.    Rennis Harding, PA-C 12/04/20 1208

## 2020-12-04 NOTE — ED Triage Notes (Signed)
Bilateral knee pain, swelling to LT knee and knot on RT knee after falling in a parking lot last night.

## 2020-12-16 ENCOUNTER — Other Ambulatory Visit: Payer: Self-pay | Admitting: Family Medicine

## 2021-01-08 ENCOUNTER — Other Ambulatory Visit: Payer: Self-pay | Admitting: Family Medicine

## 2021-01-31 ENCOUNTER — Other Ambulatory Visit: Payer: Self-pay | Admitting: Family Medicine

## 2021-02-11 ENCOUNTER — Ambulatory Visit: Payer: PRIVATE HEALTH INSURANCE | Admitting: Family Medicine

## 2021-02-11 DIAGNOSIS — Z0289 Encounter for other administrative examinations: Secondary | ICD-10-CM

## 2021-02-21 ENCOUNTER — Ambulatory Visit (INDEPENDENT_AMBULATORY_CARE_PROVIDER_SITE_OTHER): Payer: PRIVATE HEALTH INSURANCE | Admitting: Family Medicine

## 2021-02-21 ENCOUNTER — Encounter: Payer: Self-pay | Admitting: Family Medicine

## 2021-02-21 ENCOUNTER — Other Ambulatory Visit: Payer: Self-pay

## 2021-02-21 VITALS — BP 118/70 | HR 76 | Temp 97.2°F | Ht 65.0 in | Wt 245.4 lb

## 2021-02-21 DIAGNOSIS — R739 Hyperglycemia, unspecified: Secondary | ICD-10-CM | POA: Diagnosis not present

## 2021-02-21 DIAGNOSIS — K589 Irritable bowel syndrome without diarrhea: Secondary | ICD-10-CM

## 2021-02-21 DIAGNOSIS — Z13220 Encounter for screening for lipoid disorders: Secondary | ICD-10-CM

## 2021-02-21 DIAGNOSIS — H6983 Other specified disorders of Eustachian tube, bilateral: Secondary | ICD-10-CM | POA: Diagnosis not present

## 2021-02-21 DIAGNOSIS — Z1211 Encounter for screening for malignant neoplasm of colon: Secondary | ICD-10-CM

## 2021-02-21 DIAGNOSIS — M545 Low back pain, unspecified: Secondary | ICD-10-CM | POA: Diagnosis not present

## 2021-02-21 DIAGNOSIS — G43809 Other migraine, not intractable, without status migrainosus: Secondary | ICD-10-CM

## 2021-02-21 DIAGNOSIS — G8929 Other chronic pain: Secondary | ICD-10-CM

## 2021-02-21 LAB — COMPREHENSIVE METABOLIC PANEL
ALT: 11 U/L (ref 0–35)
AST: 12 U/L (ref 0–37)
Albumin: 4.1 g/dL (ref 3.5–5.2)
Alkaline Phosphatase: 64 U/L (ref 39–117)
BUN: 14 mg/dL (ref 6–23)
CO2: 30 mEq/L (ref 19–32)
Calcium: 9.1 mg/dL (ref 8.4–10.5)
Chloride: 103 mEq/L (ref 96–112)
Creatinine, Ser: 0.87 mg/dL (ref 0.40–1.20)
GFR: 76.89 mL/min (ref >60.00)
Glucose, Bld: 94 mg/dL (ref 70–99)
Potassium: 4.2 mEq/L (ref 3.5–5.1)
Sodium: 139 mEq/L (ref 135–145)
Total Bilirubin: 0.4 mg/dL (ref 0.2–1.2)
Total Protein: 7.4 g/dL (ref 6.0–8.3)

## 2021-02-21 LAB — LIPID PANEL
Cholesterol: 169 mg/dL (ref 0–200)
HDL: 43.6 mg/dL (ref >39.00)
LDL Cholesterol: 105 mg/dL — ABNORMAL HIGH (ref 0–99)
NonHDL: 125.31
Total CHOL/HDL Ratio: 4
Triglycerides: 101 mg/dL (ref 0.0–149.0)
VLDL: 20.2 mg/dL (ref 0.0–40.0)

## 2021-02-21 LAB — CBC
HCT: 37.5 % (ref 36.0–46.0)
Hemoglobin: 12 g/dL (ref 12.0–15.0)
MCHC: 31.9 g/dL (ref 30.0–36.0)
MCV: 82 fl (ref 78.0–100.0)
Platelets: 231 10*3/uL (ref 150.0–400.0)
RBC: 4.57 Mil/uL (ref 3.87–5.11)
RDW: 15.2 % (ref 11.5–15.5)
WBC: 8.1 10*3/uL (ref 4.0–10.5)

## 2021-02-21 LAB — HEMOGLOBIN A1C: Hgb A1c MFr Bld: 6.3 % (ref 4.6–6.5)

## 2021-02-21 LAB — TSH: TSH: 2.87 u[IU]/mL (ref 0.35–5.50)

## 2021-02-21 MED ORDER — LINACLOTIDE 72 MCG PO CAPS: 72.0000 ug | ORAL_CAPSULE | Freq: Every day | ORAL | 5 refills | Status: DC

## 2021-02-21 MED ORDER — CELECOXIB 200 MG PO CAPS: 200.0000 mg | ORAL_CAPSULE | Freq: Two times a day (BID) | ORAL | 5 refills | Status: DC

## 2021-02-21 MED ORDER — OZEMPIC (0.25 OR 0.5 MG/DOSE) 2 MG/1.5ML ~~LOC~~ SOPN: 0.2500 mg | PEN_INJECTOR | SUBCUTANEOUS | 0 refills | Status: DC

## 2021-02-21 MED ORDER — OZEMPIC (0.25 OR 0.5 MG/DOSE) 2 MG/1.5ML ~~LOC~~ SOPN: PEN_INJECTOR | SUBCUTANEOUS | 4 refills | Status: DC

## 2021-02-21 NOTE — Progress Notes (Signed)
Explained and demonstrated to pt how to give Ozempic 0.25 mg with demonstration pen. Pt verbalized understanding. Pt then demonstrated back to me and gave herself her first weekly injection of Ozempic. Pt did well and had no further questions.

## 2021-02-21 NOTE — Assessment & Plan Note (Addendum)
She has tried several things for weight loss including diet, exercise, and medical weight management.  This continues to be a source of frustration.  We discussed pharmacologic management.  We will start Ozempic 0.25 mg weekly for 4 weeks and then adjust dose as tolerated.  She is also discussed clinically.  This may be reasonable option if she does not do well with the Ozempic.  She will continue working on diet and exercise.  We will check again again via MyChart in about 3 to 4 weeks.  We can see her again in office in about 3 months.  Check labs today.

## 2021-02-21 NOTE — Assessment & Plan Note (Signed)
Occasionally gets constipation.  We will send in Linzess.  She has done well with this in the past.  We will continue high-dose amine.

## 2021-02-21 NOTE — Assessment & Plan Note (Signed)
No red flags.  Symptoms are stable.  Continue Celebrex 200 mg twice daily, Flexeril 10 mg as needed.

## 2021-02-21 NOTE — Addendum Note (Signed)
Addended by: Jimmye Norman on: 02/21/2021 04:07 PM   Modules accepted: Orders

## 2021-02-21 NOTE — Assessment & Plan Note (Signed)
Follows with ENT.  Tympanostomy tube in place.

## 2021-02-21 NOTE — Assessment & Plan Note (Signed)
Stable on the Phenergan and Maxalt as needed.

## 2021-02-21 NOTE — Patient Instructions (Signed)
It was very nice to see you today!  Please start the Ozempic.  Take 0.25 mg weekly for 4 weeks and then we can increase to 0.5 mg weekly.  Please send a message in about 3 to 4 weeks to let me know how you are doing.  I will refill your Linzess and Celebrex.  I will refer you to see a gastroenterologist.  I will see you back in about 3 to 6 months.  Please send her to see me sooner if needed.  Take care, Dr Jimmey Ralph  PLEASE NOTE:  If you had any lab tests please let us know if you have not heard back within a few days. You may see your results on mychart before we have a chance to review them but we will give you a call once they are reviewed by [place]. If we ordered any referrals today, please let us know if you have not heard from their office within the next week.   Please try these tips to maintain a healthy lifestyle:  Eat at least 3 REAL meals and 1-2 snacks per day.  Aim for no more than 5 hours between eating.  If you eat breakfast, please do so within one hour of getting up.   Each meal should contain half fruits/vegetables, one quarter protein, and one quarter carbs (no bigger than a computer mouse)  Cut down on sweet beverages. This includes juice, soda, and sweet tea.   Drink at least 1 glass of water with each meal and aim for at least 8 glasses per day  Exercise at least 150 minutes every week.

## 2021-02-21 NOTE — Progress Notes (Signed)
Tracy Wise is a 51 y.o. female who presents today for an office visit.  Assessment/Plan:  Chronic Problems Addressed Today: Morbid obesity (HCC) She has tried several things for weight loss including diet, exercise, and medical weight management.  This continues to be a source of frustration.  We discussed pharmacologic management.  We will start Ozempic 0.25 mg weekly for 4 weeks and then adjust dose as tolerated.  She is also discussed clinically.  This may be reasonable option if she does not do well with the Ozempic.  She will continue working on diet and exercise.  We will check again again via MyChart in about 3 to 4 weeks.  We can see her again in office in about 3 months.  Check labs today.  Migraine Stable on the Phenergan and Maxalt as needed.  IBS (irritable bowel syndrome) Occasionally gets constipation.  We will send in Linzess.  She has done well with this in the past.  We will continue high-dose amine.  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  Symptoms are stable.  Continue Celebrex 200 mg twice daily, Flexeril 10 mg as needed.  Dysfunction of both eustachian tubes Follows with ENT.  Tympanostomy tube in place.  Will refer to gastroenterology for colon cancer screening.    Subjective:  HPI: She complains of issues with constipation and requests a re-prescription of medication to alleviate symptoms. She is going to a check-up visit with [employer] to check on her ears. Her left ear "bothers her some", with popping and clogging. Her right ear pops some, but the tube that [employer] put in alleviates the symptoms.    She ran out of celebrex to deal with her arthritis pain, and has been forgetting to get it refilled. Because of this she has been taking ibuprofen in the meantime. Her back pain can flare-up on long drives, but otherwise is well managed. She expresses interest in trying medication such as Plenity to manage her weight.    Since her hysterectomy she has been experiencing weight gain each year. The ovaries and fallopian tubes are still present, just the uterus was removed. More recently, she has been having a significantly larger weight gain with recent treatments she has had. She has exercised and tried various diets, and would lose around 12-15 pounds but has been unable to lose more weight.    She avoids caffeine and has been wary of particular weight medications due to her SVT. Additionally, she notes that her lower leg edema has gone down significantly or had resolved, so she has been able to put her ankle bracelets back on again. She also notes that her arthritis condition has improved.       Objective:  Physical Exam: BP 118/70 (BP Location: Left Arm, Patient Position: Sitting, Cuff Size: Large)   Pulse 76   Temp (!) 97.2 F (36.2 C) (Temporal)   Ht 5\' 5"  (1.651 m)   Wt 245 lb 6.1 oz (111.3 kg)   BMI 40.83 kg/m   Gen: No acute distress, resting comfortably HEENT: Left TM clear.  Right TM with tympanostomy tube present. CV: Regular rate and rhythm with no murmurs appreciated Pulm: Normal work of breathing, clear to auscultation bilaterally  with no crackles, wheezes, or rhonchi Neuro: Grossly normal, moves all extremities Psych: Normal affect and thought content      I,Jordan Burklow,acting as a scribe for , MD.,have documented all relevant documentation on the behalf of  [name], MD,as directed by  [name], MD while in the presence of [name], MD.   I, [name], MD, have reviewed all documentation for this visit. The documentation on 02/21/21 for the exam, diagnosis, procedures, and orders are all accurate and complete.  Katina Degree. Jimmey Ralph, MD 02/21/2021 11:17 AM

## 2021-02-24 ENCOUNTER — Encounter: Payer: Self-pay | Admitting: Family Medicine

## 2021-02-24 DIAGNOSIS — R7303 Prediabetes: Secondary | ICD-10-CM | POA: Insufficient documentation

## 2021-02-24 DIAGNOSIS — E119 Type 2 diabetes mellitus without complications: Secondary | ICD-10-CM | POA: Insufficient documentation

## 2021-02-24 NOTE — Progress Notes (Signed)
Please inform patient of the following:  Her A1c is in the pre diabetic range. The ozempic we started should help bring this down. Her cholesterol is borderline elevated. Do not need to start any cholesterol meds.  She  should continue working on diet exercise.  I would like for her to come back in 3 months to recheck her A1c.  Tracy Wise. Jimmey Ralph, MD 02/24/2021 8:09 AM

## 2021-02-27 ENCOUNTER — Other Ambulatory Visit: Payer: Self-pay | Admitting: Family Medicine

## 2021-02-28 NOTE — Telephone Encounter (Signed)
See below, do you want to try changing meds or prior auth?

## 2021-03-03 NOTE — Telephone Encounter (Signed)
Can you handle this please, I called to initiate this but I don't have Dr. Cory Roughen info that's needed.

## 2021-03-05 NOTE — Telephone Encounter (Signed)
Spoke with insurance [phone] Stated Rx Ozempic does not required PA

## 2021-03-05 NOTE — Telephone Encounter (Signed)
Call pharmacy, pharmacist stated he run the Rx and required PA  Patient notified, advised to call insurance for verification

## 2021-03-05 NOTE — Telephone Encounter (Signed)
Patient returned call stated PA was proved  Insurance informed pharmacy of determination

## 2021-03-11 ENCOUNTER — Other Ambulatory Visit: Payer: Self-pay | Admitting: *Deleted

## 2021-03-11 MED ORDER — OZEMPIC (0.25 OR 0.5 MG/DOSE) 2 MG/1.5ML ~~LOC~~ SOPN: PEN_INJECTOR | SUBCUTANEOUS | 4 refills | Status: DC

## 2021-03-13 ENCOUNTER — Other Ambulatory Visit: Payer: Self-pay | Admitting: Family Medicine

## 2021-03-17 ENCOUNTER — Other Ambulatory Visit: Payer: Self-pay | Admitting: *Deleted

## 2021-03-17 MED ORDER — OZEMPIC (0.25 OR 0.5 MG/DOSE) 2 MG/1.5ML ~~LOC~~ SOPN: PEN_INJECTOR | SUBCUTANEOUS | 3 refills | Status: DC

## 2021-04-15 ENCOUNTER — Other Ambulatory Visit: Payer: Self-pay | Admitting: Family Medicine

## 2021-05-20 ENCOUNTER — Other Ambulatory Visit: Payer: Self-pay

## 2021-05-20 ENCOUNTER — Ambulatory Visit (INDEPENDENT_AMBULATORY_CARE_PROVIDER_SITE_OTHER): Payer: PRIVATE HEALTH INSURANCE | Admitting: Family Medicine

## 2021-05-20 ENCOUNTER — Other Ambulatory Visit: Payer: Self-pay | Admitting: Family Medicine

## 2021-05-20 ENCOUNTER — Encounter: Payer: Self-pay | Admitting: Family Medicine

## 2021-05-20 VITALS — BP 108/72 | HR 70 | Temp 98.0°F | Ht 65.0 in | Wt 235.8 lb

## 2021-05-20 DIAGNOSIS — R7303 Prediabetes: Secondary | ICD-10-CM

## 2021-05-20 DIAGNOSIS — Z0001 Encounter for general adult medical examination with abnormal findings: Secondary | ICD-10-CM | POA: Diagnosis not present

## 2021-05-20 DIAGNOSIS — G43809 Other migraine, not intractable, without status migrainosus: Secondary | ICD-10-CM | POA: Diagnosis not present

## 2021-05-20 DIAGNOSIS — I471 Supraventricular tachycardia: Secondary | ICD-10-CM

## 2021-05-20 DIAGNOSIS — H6983 Other specified disorders of Eustachian tube, bilateral: Secondary | ICD-10-CM

## 2021-05-20 DIAGNOSIS — G4700 Insomnia, unspecified: Secondary | ICD-10-CM | POA: Insufficient documentation

## 2021-05-20 DIAGNOSIS — K589 Irritable bowel syndrome without diarrhea: Secondary | ICD-10-CM

## 2021-05-20 MED ORDER — TIRZEPATIDE 5 MG/0.5ML ~~LOC~~ SOAJ: 5.0000 mg | SUBCUTANEOUS | 0 refills | Status: DC

## 2021-05-20 MED ORDER — CIPROFLOXACIN-DEXAMETHASONE 0.3-0.1 % OT SUSP: 4.0000 [drp] | Freq: Two times a day (BID) | OTIC | 0 refills | Status: DC

## 2021-05-20 NOTE — Assessment & Plan Note (Signed)
Referral to GI is pending.  May benefit from low-dose amitriptyline at night.  We will continue current regimen with Linzess and hyoscyamine.

## 2021-05-20 NOTE — Progress Notes (Signed)
Chief Complaint:  Tracy Wise is a 50 y.o. female who presents today for her annual comprehensive physical exam.    Assessment/Plan:  New/Acute Problems: Ear irritation Start Ciprodex drops.  Advised her to follow-up with ENT soon.  Chronic Problems Addressed Today: Prediabetes We will be switching Ozempic to [facility].  We can recheck A1c at next office visit.  Morbid obesity (HCC) She is down about 10 pounds since her last visit.  She was doing well with Ozempic though we will switch to [facility] 5mg  weekly per patient request.  She will check in with me in a few weeks via MyChart and we can titrate dose as needed.  Migraine Stable on Phenergan and Maxalt as needed.  She is having some insomnia issues.  If continues to have issues with insomnia may consider low-dose amitriptyline at night which should also help with migraines.  Dysfunction of both eustachian tubes Advised her to follow-up with ENT soon.  IBS (irritable bowel syndrome) Referral to GI is pending.  May benefit from low-dose amitriptyline at night.  We will continue current regimen with Linzess and hyoscyamine.  Insomnia She has very vivid dreams with melatonin.  Benadryl was ineffective in the past.  We discussed sleep hygiene measures.  Will not make any medication changes today though at some point in the future would consider trial of amitriptyline nightly.  SVT (supraventricular tachycardia) (HCC) Stable.  Follows with cardiology.  On 50 mg metoprolol tartrate twice daily.   Body mass index is 39.24 kg/m. / Obese  BMI Metric Follow Up - 05/20/21 1154       BMI Metric Follow Up-Please document annually   BMI Metric Follow Up Education provided              Preventative Healthcare: Is not a Pap due to history of hysterectomy.  She is due for colon cancer screening and mammogram that she will get these scheduled soon.  Referral to GI is pending.  Had labs done a few months ago.  Do not need to  repeat today.  Patient Counseling(The following topics were reviewed and/or handout was given):  -Nutrition: Stressed importance of moderation in sodium/caffeine intake, saturated fat and cholesterol, caloric balance, sufficient intake of fresh fruits, vegetables, and fiber.  -Stressed the importance of regular exercise.   -Substance Abuse: Discussed cessation/primary prevention of tobacco, alcohol, or other drug use; driving or other dangerous activities under the influence; availability of treatment for abuse.   -Injury prevention: Discussed safety belts, safety helmets, smoke detector, smoking near bedding or upholstery.   -Sexuality: Discussed sexually transmitted diseases, partner selection, use of condoms, avoidance of unintended pregnancy and contraceptive alternatives.   -Dental health: Discussed importance of regular tooth brushing, flossing, and dental visits.  -Health maintenance and immunizations reviewed. Please refer to Health maintenance section.  Return to care in 1 year for next preventative visit.     Subjective:  HPI:  She complains of itching in her right ear which started about a month. She follows up with her ENT for this issue. Her ENT prescribed her ear drops which seems to be helping.  She had hysterectomy done in 2011. She still have issue with weight gain. She is currently on Ozempic 0.5 mg for her weight loss. She is requesting to start on Mounjaro. She is willing to start on low does and then increase the dosage if needed. She is tolerating her other medication well with no side effects.    Depression screen [facility] 2/9  05/20/2021  Decreased Interest 0  Down, Depressed, Hopeless 0  PHQ - 2 Score 0    Health Maintenance Due  Topic Date Due   HIV Screening  Never done   Hepatitis C Screening  Never done   COLONOSCOPY (Pts 45-13yrs Insurance coverage will need to be confirmed)  Never done   MAMMOGRAM  Never done    ROS: Per HPI, otherwise a complete review of  systems was negative.   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   Chronic low back pain with pars defect s/p fusion and decompression 2020 05/16/2020   Wheezing 05/16/2020   Leg edema 05/16/2020   IBS (irritable bowel syndrome) 05/16/2020   Migraine 05/16/2020   SVT (supraventricular tachycardia) (HCC) 05/15/2016   Past Surgical History:  Procedure Laterality Date   BACK SURGERY  03/24/2019   LAPAROSCOPIC TOTAL HYSTERECTOMY     TYMPANOSTOMY TUBE PLACEMENT Right     Family History  Problem Relation Age of Onset   Healthy Mother    Asthma Mother    Heart disease Father    Cancer Father     Medications- reviewed and updated Current Outpatient Medications  Medication Sig Dispense Refill   ciprofloxacin-dexamethasone (CIPRODEX) OTIC suspension Place 4 drops into both ears 2 (two) times daily. 7.5 mL 0   tirzepatide (MOUNJARO) 5 MG/0.5ML Pen Inject 5 mg into the skin once a week. 2 mL 0   celecoxib (CELEBREX) 200 MG capsule Take 1 capsule (200 mg total) by mouth 2 (two) times daily. 60 capsule 5   cyclobenzaprine (FLEXERIL) 10 MG tablet TAKE 1 TABLET (10 MG TOTAL) BY MOUTH AS NEEDED FOR MUSCLE SPASMS. 30 tablet 2   hyoscyamine (ANASPAZ) 0.125 MG TBDP disintergrating tablet PLACE 1 TABLET (0.125 MG TOTAL) UNDER THE TONGUE EVERY 6 (SIX) HOURS AS NEEDED. 360 tablet 1   linaclotide (LINZESS) 72 MCG capsule Take 1 capsule (72 mcg total) by mouth daily before breakfast. 30 capsule 5   metoprolol tartrate (LOPRESSOR) 50 MG tablet  Take 50 mg by mouth 2 (two) times daily.     promethazine (PHENERGAN) 25 MG tablet Take 1 tablet (25 mg total) by mouth every 6 (six) hours as needed. 30 tablet 5   rizatriptan (MAXALT) 10 MG tablet Take 1 tablet (10 mg total) by mouth as needed for migraine. 10 tablet 5   No current facility-administered medications for this visit.    Allergies-reviewed and updated Allergies  Allergen Reactions   Amoxicillin     "I get UTIs"    Social History   Socioeconomic History   Marital status: Married    Spouse name: Not on file   Number of children: Not on file   Years of education: Not on file   Highest education level: Not on file  Occupational History   Not on file  Tobacco Use   Smoking status: Never   Smokeless tobacco: Never  Substance and Sexual Activity   Alcohol use: No   Drug use: No   Sexual activity: Not Currently  Other Topics Concern   Not on file  Social History Narrative   Not on file   Social Determinants of Health   Financial Resource Strain: Not on file  Food Insecurity: Not on file  Transportation Needs: Not on file  Physical Activity: Not on file  Stress: Not on file  Social Connections: Not on file        Objective:  Physical Exam: BP 108/72   Pulse 70   Temp 98 F (36.7 C) (Temporal)   Ht 5\' 5"  (1.651 m)   Wt 235 lb 12.8 oz (107 kg)   SpO2 100%   BMI 39.24 kg/m   Body mass index is 39.24 kg/m. Wt Readings from Last 3 Encounters:  05/20/21 235 lb 12.8 oz (107 kg)  02/21/21 245 lb 6.1 oz (111.3 kg)  05/16/20 245 lb 3.2 oz (111.2 kg)   Gen: NAD, resting comfortably HEENT: Right tympanostomy tube in place.  Bilateral EACs with erythema and irritation. CV: RRR with no murmurs appreciated Pulm: NWOB, CTAB with no crackles, wheezes, or rhonchi GI: Normal bowel sounds present. Soft, Nontender, Nondistended. MSK: no edema, cyanosis, or clubbing noted Skin: warm, dry Neuro: CN2-12 grossly intact. Strength 5/5 in upper and lower extremities.  Reflexes symmetric and intact bilaterally.  Psych: Normal affect and thought content     Charmel Pronovost M. 05/18/20, MD 05/20/2021 11:54 AM

## 2021-05-20 NOTE — Assessment & Plan Note (Signed)
Stable on Phenergan and Maxalt as needed.  She is having some insomnia issues.  If continues to have issues with insomnia may consider low-dose amitriptyline at night which should also help with migraines.

## 2021-05-20 NOTE — Assessment & Plan Note (Signed)
Advised her to follow-up with ENT soon.

## 2021-05-20 NOTE — Assessment & Plan Note (Signed)
She has very vivid dreams with melatonin.  Benadryl was ineffective in the past.  We discussed sleep hygiene measures.  Will not make any medication changes today though at some point in the future would consider trial of amitriptyline nightly.

## 2021-05-20 NOTE — Patient Instructions (Signed)
It was very nice to see you today!  Please start the Ciprodex drops.  We will switch Ozempic to [facility].  Please continue work on diet and exercise.  Please send a message in a few weeks on MyChart in a few weeks to let me know how the Darcel Bayley is working.  Please get your mammogram done soon.  You should be getting a call about the GI referral soon.  I will see back in year for your next physical.  Please come back to see me sooner if needed.  Take care, Dr Jerline Pain  PLEASE NOTE:  If you had any lab tests please let us know if you have not heard back within a few days. You may see your results on mychart before we have a chance to review them but we will give you a call once they are reviewed by [place]. If we ordered any referrals today, please let us know if you have not heard from their office within the next week.   Please try these tips to maintain a healthy lifestyle:  Eat at least 3 REAL meals and 1-2 snacks per day.  Aim for no more than 5 hours between eating.  If you eat breakfast, please do so within one hour of getting up.   Each meal should contain half fruits/vegetables, one quarter protein, and one quarter carbs (no bigger than a computer mouse)  Cut down on sweet beverages. This includes juice, soda, and sweet tea.   Drink at least 1 glass of water with each meal and aim for at least 8 glasses per day  Exercise at least 150 minutes every week.    Preventive Care 79-15 Years Old, Female Preventive care refers to lifestyle choices and visits with your health care provider that can promote health and wellness. This includes: A yearly physical exam. This is also called an annual wellness visit. Regular dental and eye exams. Immunizations. Screening for certain conditions. Healthy lifestyle choices, such as: Eating a healthy diet. Getting regular exercise. Not using drugs or products that contain nicotine and tobacco. Limiting alcohol use. What can I expect for my  preventive care visit? Physical exam Your health care provider will check your: Height and weight. These may be used to calculate your BMI (body mass index). BMI is a measurement that tells if you are at a healthy weight. Heart rate and blood pressure. Body temperature. Skin for abnormal spots. Counseling Your health care provider may ask you questions about your: Past medical problems. Family's medical history. Alcohol, tobacco, and drug use. Emotional well-being. Home life and relationship well-being. Sexual activity. Diet, exercise, and sleep habits. Work and work Statistician. Access to firearms. Method of birth control. Menstrual cycle. Pregnancy history. What immunizations do I need? Vaccines are usually given at various ages, according to a schedule. Your health care provider will recommend vaccines for you based on your age, medical history, and lifestyle or other factors, such as travel or where you work. What tests do I need? Blood tests Lipid and cholesterol levels. These may be checked every 5 years, or more often if you are over 69 years old. Hepatitis C test. Hepatitis B test. Screening Lung cancer screening. You may have this screening every year starting at age 52 if you have a 30-pack-year history of smoking and currently smoke or have quit within the past 15 years. Colorectal cancer screening. All adults should have this screening starting at age 34 and continuing until age 64. Your health care provider  may recommend screening at age 14 if you are at increased risk. You will have tests every 1-10 years, depending on your results and the type of screening test. Diabetes screening. This is done by checking your blood sugar (glucose) after you have not eaten for a while (fasting). You may have this done every 1-3 years. Mammogram. This may be done every 1-2 years. Talk with your health care provider about when you should start having regular mammograms. This may  depend on whether you have a family history of breast cancer. BRCA-related cancer screening. This may be done if you have a family history of breast, ovarian, tubal, or peritoneal cancers. Pelvic exam and Pap test. This may be done every 3 years starting at age 85. Starting at age 54, this may be done every 5 years if you have a Pap test in combination with an HPV test. Other tests STD (sexually transmitted disease) testing, if you are at risk. Bone density scan. This is done to screen for osteoporosis. You may have this scan if you are at high risk for osteoporosis. Talk with your health care provider about your test results, treatment options, and if necessary, the need for more tests. Follow these instructions at home: Eating and drinking  Eat a diet that includes fresh fruits and vegetables, whole grains, lean protein, and low-fat dairy products. Take vitamin and mineral supplements as recommended by your health care provider. Do not drink alcohol if: Your health care provider tells you not to drink. You are pregnant, may be pregnant, or are planning to become pregnant. If you drink alcohol: Limit how much you have to 0-1 drink a day. Be aware of how much alcohol is in your drink. In the [place] one drink equals one 12 oz bottle of beer (355 mL), one 5 oz glass of wine (148 mL), or one 1 oz glass of hard liquor (44 mL). Lifestyle Take daily care of your teeth and gums. Brush your teeth every morning and night with fluoride toothpaste. Floss one time each day. Stay active. Exercise for at least 30 minutes 5 or more days each week. Do not use any products that contain nicotine or tobacco, such as cigarettes, e-cigarettes, and chewing tobacco. If you need help quitting, ask your health care provider. Do not use drugs. If you are sexually active, practice safe sex. Use a condom or other form of protection to prevent STIs (sexually transmitted infections). If you do not wish to become  pregnant, use a form of birth control. If you plan to become pregnant, see your health care provider for a prepregnancy visit. If told by your health care provider, take low-dose aspirin daily starting at age 8. Find healthy ways to cope with stress, such as: Meditation, yoga, or listening to music. Journaling. Talking to a trusted person. Spending time with friends and family. Safety Always wear your seat belt while driving or riding in a vehicle. Do not drive: If you have been drinking alcohol. Do not ride with someone who has been drinking. When you are tired or distracted. While texting. Wear a helmet and other protective equipment during sports activities. If you have firearms in your house, make sure you follow all gun safety procedures. What's next? Visit your health care provider once a year for an annual wellness visit. Ask your health care provider how often you should have your eyes and teeth checked. Stay up to date on all vaccines. This information is not intended to replace advice given  to you by your health care provider. Make sure you discuss any questions you have with your health care provider. Document Revised: 10/11/2020 Document Reviewed: 04/14/2018 Elsevier Patient Education  2022 [employer].

## 2021-05-20 NOTE — Assessment & Plan Note (Signed)
Stable.  Follows with cardiology.  On 50 mg metoprolol tartrate twice daily.

## 2021-05-20 NOTE — Assessment & Plan Note (Signed)
We will be switching Ozempic to [facility].  We can recheck A1c at next office visit.

## 2021-05-20 NOTE — Assessment & Plan Note (Signed)
She is down about 10 pounds since her last visit.  She was doing well with Ozempic though we will switch to [facility] 5mg  weekly per patient request.  She will check in with me in a few weeks via MyChart and we can titrate dose as needed.

## 2021-05-22 ENCOUNTER — Ambulatory Visit: Payer: PRIVATE HEALTH INSURANCE | Admitting: Gastroenterology

## 2021-05-26 NOTE — Telephone Encounter (Signed)
Patient was given [facility] coupon

## 2021-06-02 ENCOUNTER — Encounter: Payer: Self-pay | Admitting: Family Medicine

## 2021-06-04 DIAGNOSIS — L299 Pruritus, unspecified: Secondary | ICD-10-CM | POA: Insufficient documentation

## 2021-06-17 ENCOUNTER — Telehealth: Payer: Self-pay | Admitting: Physician Assistant

## 2021-06-17 ENCOUNTER — Ambulatory Visit (INDEPENDENT_AMBULATORY_CARE_PROVIDER_SITE_OTHER): Payer: PRIVATE HEALTH INSURANCE | Admitting: Physician Assistant

## 2021-06-17 ENCOUNTER — Encounter: Payer: Self-pay | Admitting: Physician Assistant

## 2021-06-17 VITALS — BP 110/80 | HR 62 | Ht 65.0 in | Wt 235.4 lb

## 2021-06-17 DIAGNOSIS — Z1211 Encounter for screening for malignant neoplasm of colon: Secondary | ICD-10-CM

## 2021-06-17 DIAGNOSIS — K5909 Other constipation: Secondary | ICD-10-CM | POA: Diagnosis not present

## 2021-06-17 DIAGNOSIS — K581 Irritable bowel syndrome with constipation: Secondary | ICD-10-CM | POA: Diagnosis not present

## 2021-06-17 MED ORDER — PLENVU 140 G PO SOLR: 1.0000 | ORAL | 0 refills | Status: DC

## 2021-06-17 NOTE — Patient Instructions (Addendum)
If you are age 52 or younger, your body mass index should be between 19-25. Your Body mass index is 39.17 kg/m. If this is out of the aformentioned range listed, please consider follow up with your Primary Care Provider.  ________________________________________________________  The [facility] providers would like to encourage you to use [facility] to communicate with providers for non-urgent requests or questions.  Due to long hold times on the telephone, sending your provider a message by [facility] may be a faster and more efficient way to get a response.  Please allow 48 business hours for a response.  Please remember that this is for non-urgent requests.  _______________________________________________________  Bonita Quin have been scheduled for a colonoscopy. Please follow written instructions given to you at your visit today.  Please pick up your prep supplies at the pharmacy within the next 1-3 days. If you use inhalers (even only as needed), please bring them with you on the day of your procedure.  Start Miralax 1 capful in 8 ounces of water/juice daily  Increase your water intake to 60 ounces daily  Increase your fiber intake.Consider using Benefiber daily  Check with your Insurance to see if they will approve Amitiza, Trulance or Motegrity  to replace your Linzess for IBS-C.  Follow up pending the results of your Colonoscopy or as needed.  Thank you for entrusting me with your care and choosing [facility].  Amy Esterwood, PA-C

## 2021-06-17 NOTE — Progress Notes (Signed)
Subjective:    Patient ID: Tracy Wise, female    DOB: 01/04/1971, 50 y.o.   MRN: [id number]  HPI Tracy Wise" is a 50 year old African-American female, new to GI today referred by Tracy Chyle, MD for screening colonoscopy and also with complaints of IBS -C. Patient says that she did undergo prior upper endoscopy about 15 years ago per Tracy Wise, when she was having issues with IBS.  She has not had any prior colonoscopy.  She says she started having significant problems with constipation after she underwent hysterectomy in 2012.  She had recently been placed on Linzess 72 mcg daily and says that was helping quite a bit.  She was having much more frequent bowel movements.  She stopped it about a week and a half ago because her insurance would not cover the medication and she says now she has not had a bowel movement in at least a week.  She generally does not like to take laxatives on any regular basis.  She has not noticed any recent changes in bowel habits, no melena or hematochezia and no abdominal pain.  She says she does have an umbilical hernia but it does not bother her. She does have family history of mom and sister both with colon polyps, no family history of colon cancer that she is aware of. Other medical issues include obesity, BMI 39, adult onset diabetes mellitus, osteoarthritis, TMJ.  Review of Systems Pertinent positive and negative review of systems were noted in the above HPI section.  All other review of systems was otherwise negative.   Outpatient Encounter Medications as of 06/17/2021  Medication Sig   celecoxib (CELEBREX) 200 MG capsule Take 1 capsule (200 mg total) by mouth 2 (two) times daily.   ciprofloxacin-dexamethasone (CIPRODEX) OTIC suspension Place 4 drops into both ears 2 (two) times daily.   cyclobenzaprine (FLEXERIL) 10 MG tablet TAKE 1 TABLET (10 MG TOTAL) BY MOUTH AS NEEDED FOR MUSCLE SPASMS.   hyoscyamine (ANASPAZ) 0.125 MG TBDP disintergrating  tablet PLACE 1 TABLET (0.125 MG TOTAL) UNDER THE TONGUE EVERY 6 (SIX) HOURS AS NEEDED.   metoprolol tartrate (LOPRESSOR) 50 MG tablet Take 50 mg by mouth 2 (two) times daily.   PEG-KCl-NaCl-NaSulf-Na Asc-C (PLENVU) 140 g SOLR Take 1 kit by mouth as directed.   promethazine (PHENERGAN) 25 MG tablet Take 1 tablet (25 mg total) by mouth every 6 (six) hours as needed.   rizatriptan (MAXALT) 10 MG tablet Take 1 tablet (10 mg total) by mouth as needed for migraine.   tirzepatide [facility]) 5 MG/0.5ML Pen Inject 5 mg into the skin once a week.   [DISCONTINUED] linaclotide (LINZESS) 72 MCG capsule Take 1 capsule (72 mcg total) by mouth daily before breakfast.   No facility-administered encounter medications on file as of 06/17/2021.   Allergies  Allergen Reactions   Amoxicillin     "I get UTIs"   Patient Active Problem List   Diagnosis Date Noted   Insomnia 05/20/2021   Prediabetes 02/24/2021   Morbid obesity (Tracy Wise) 02/21/2021   Dysfunction of both eustachian tubes 05/16/2020   Osteoarthritis 05/16/2020   TMJ arthralgia 05/16/2020   Chronic low back pain with pars defect s/p fusion and decompression 2020 05/16/2020   Wheezing 05/16/2020   Leg edema 05/16/2020   IBS (irritable bowel syndrome) 05/16/2020   Migraine 05/16/2020   SVT (supraventricular tachycardia) ([place]) 05/15/2016   Social History   Socioeconomic History   Marital status: Married    Spouse name: Not on  file   Number of children: Not on file   Years of education: Not on file   Highest education level: Not on file  Occupational History   Not on file  Tobacco Use   Smoking status: Never   Smokeless tobacco: Never  Vaping Use   Vaping Use: Never used  Substance and Sexual Activity   Alcohol use: No   Drug use: No   Sexual activity: Not Currently  Other Topics Concern   Not on file  Social History Narrative   Not on file   Social Determinants of Health   Financial Resource Strain: Not on file  Food Insecurity:  Not on file  Transportation Needs: Not on file  Physical Activity: Not on file  Stress: Not on file  Social Connections: Not on file  Intimate Partner Violence: Not on file    Ms. Tracy Wise family history includes Asthma in her mother; Cancer in her father; Healthy in her mother; Heart disease in her father.      Objective:    Vitals:   06/17/21 1147  BP: 110/80  Pulse: 62    Physical Exam Well-developed well-nourished AA female  in no acute distress.   [id number]  BMI 39.17  HEENT; nontraumatic normocephalic, EOMI, PE R LA, sclera anicteric. Oropharynx; not examined Neck; supple, no JVD Cardiovascular; regular rate and rhythm with S1-S2, no murmur rub or gallop Pulmonary; Clear bilaterally Abdomen; soft, obese nontender, nondistended, no palpable mass or hepatosplenomegaly, bowel sounds are active, small umbilical hernia, reduced, nontender Rectal; not done today Skin; benign exam, no jaundice rash or appreciable lesions Extremities; no clubbing cyanosis or edema skin warm and dry Neuro/Psych; alert and oriented x4, grossly nonfocal mood and affect appropriate        Assessment & Plan:   #58 50 year old African-American female referred for screening colonoscopy, no prior colonoscopy. #2 history of IBS-C patient had good response to Linzess 72 mcg daily however insurance will not cover and she has been off over the past week and a half with no bowel movement since.  #3 obesity-BMI 39 #4.  Adult onset diabetes mellitus #5.  History of SVT #6.  Osteoarthritis  Plan; Patient will be scheduled for colonoscopy with Dr. Loletha Wise.  Procedure was discussed in detail with the patient including indications risk and benefits and she is agreeable to proceed. As of today's visit patient is appropriate for endoscopic evaluation in the ambulatory care setting. For constipation we discussed increasing water intake to at least 60 ounces per day, increasing fiber in her diet and to consider  trial of Benefiber 2 tablespoons daily in a glass of water. I asked her to start on MiraLAX 17 g in 8 ounces of water daily until she starts having bowel movements and then use this at least a couple of times a week. She will contact her insurance company to find out which of the other medications they would cover for IBS-C, and we will be happy to give her a trial of Amitiza, Trulance, or Motegrity if covered.  Shaneeka Scarboro Genia Harold PA-C 06/17/2021   Cc: Vivi Barrack, MD

## 2021-06-17 NOTE — Telephone Encounter (Signed)
Patient called back with the name of the medication her insurance will allow her to get for her IBS.  The name of the medication is Trulance.  Thank you.

## 2021-06-18 MED ORDER — TRULANCE 3 MG PO TABS: 1.0000 | ORAL_TABLET | Freq: Every morning | ORAL | 3 refills | Status: DC

## 2021-06-18 NOTE — Telephone Encounter (Signed)
Sent in [facility] for patient. Attempted to contact patient. No answer, phone rang multiple times, unable to leave voicemail.

## 2021-06-18 NOTE — Telephone Encounter (Signed)
Left detailed voicemail

## 2021-06-23 NOTE — Progress Notes (Signed)
____________________________________________________________  Attending physician addendum:  Thank you for sending this case to me. I have reviewed the entire note and agree with the plan.  Definitely needs to use the miralax daily in order to have good quality bowel prep for colonoscopy.   Please have clinical assistant convey that to the patient.  If she is unable to use the miralax for whatever reason, then needs a split-dose golytely prep  Amada Jupiter, MD  ____________________________________________________________

## 2021-07-13 ENCOUNTER — Other Ambulatory Visit: Payer: Self-pay | Admitting: Family Medicine

## 2021-07-25 ENCOUNTER — Encounter: Payer: Self-pay | Admitting: Family Medicine

## 2021-07-25 ENCOUNTER — Other Ambulatory Visit: Payer: Self-pay | Admitting: *Deleted

## 2021-07-25 MED ORDER — TIRZEPATIDE 7.5 MG/0.5ML ~~LOC~~ SOAJ: 7.5000 mg | SUBCUTANEOUS | 0 refills | Status: DC

## 2021-07-25 NOTE — Telephone Encounter (Signed)
Rx send to pharmacy  

## 2021-07-29 ENCOUNTER — Encounter: Payer: Self-pay | Admitting: Gastroenterology

## 2021-08-01 ENCOUNTER — Ambulatory Visit (AMBULATORY_SURGERY_CENTER): Payer: PRIVATE HEALTH INSURANCE | Admitting: Gastroenterology

## 2021-08-01 ENCOUNTER — Encounter: Payer: Self-pay | Admitting: Gastroenterology

## 2021-08-01 VITALS — BP 124/58 | HR 62 | Temp 96.8°F | Resp 13 | Ht 65.0 in | Wt 235.0 lb

## 2021-08-01 DIAGNOSIS — K5909 Other constipation: Secondary | ICD-10-CM

## 2021-08-01 MED ORDER — SODIUM CHLORIDE 0.9 % IV SOLN: 500.0000 mL | Freq: Once | INTRAVENOUS | Status: DC

## 2021-08-01 NOTE — Patient Instructions (Addendum)
Call to make appointment to be seen as needed.  Especially, if TRULANCE is no longer working wee for your constipation. Repeat colonoscopy in 10 years for screening purposes. Please call if any questions or concerns.       YOU HAD AN ENDOSCOPIC PROCEDURE TODAY AT THE [facility]:   Refer to the procedure report that was given to you for any specific questions about what was found during the examination.  If the procedure report does not answer your questions, please call your gastroenterologist to clarify.  If you requested that your care partner not be given the details of your procedure findings, then the procedure report has been included in a sealed envelope for you to review at your convenience later.  YOU SHOULD EXPECT: Some feelings of bloating in the abdomen. Passage of more gas than usual.  Walking can help get rid of the air that was put into your GI tract during the procedure and reduce the bloating. If you had a lower endoscopy (such as a colonoscopy or flexible sigmoidoscopy) you may notice spotting of blood in your stool or on the toilet paper. If you underwent a bowel prep for your procedure, you may not have a normal bowel movement for a few days.  Please Note:  You might notice some irritation and congestion in your nose or some drainage.  This is from the oxygen used during your procedure.  There is no need for concern and it should clear up in a day or so.  SYMPTOMS TO REPORT IMMEDIATELY:  Following lower endoscopy (colonoscopy or flexible sigmoidoscopy):  Excessive amounts of blood in the stool  Significant tenderness or worsening of abdominal pains  Swelling of the abdomen that is new, acute  Fever of 100F or higher      For urgent or emergent issues, a gastroenterologist can be reached at any hour by calling [phone]. Do not use MyChart messaging for urgent concerns.    DIET:  We do recommend a small meal at first, but then you may proceed to  your regular diet.  Drink plenty of fluids but you should avoid alcoholic beverages for 24 hours.  ACTIVITY:  You should plan to take it easy for the rest of today and you should NOT DRIVE or use heavy machinery until tomorrow (because of the sedation medicines used during the test).    FOLLOW UP: Our staff will call the number listed on your records 48-72 hours following your procedure to check on you and address any questions or concerns that you may have regarding the information given to you following your procedure. If we do not reach you, we will leave a message.  We will attempt to reach you two times.  During this call, we will ask if you have developed any symptoms of COVID 19. If you develop any symptoms (ie: fever, flu-like symptoms, shortness of breath, cough etc.) before then, please call [phone].  If you test positive for Covid 19 in the 2 weeks post procedure, please call and report this information to [place].    If any biopsies were taken you will be contacted by phone or by letter within the next 1-3 weeks.  Please call us at [phone] if you have not heard about the biopsies in 3 weeks.    SIGNATURES/CONFIDENTIALITY: You and/or your care partner have signed paperwork which will be entered into your electronic medical record.  These signatures attest to the fact that that the information above on  your After Visit Summary has been reviewed and is understood.  Full responsibility of the confidentiality of this discharge information lies with you and/or your care-partner.

## 2021-08-01 NOTE — Progress Notes (Signed)
History and Physical:  This patient presents for endoscopic testing for: Encounter Diagnosis  Name Primary?   Chronic constipation Yes    Clinical details in 06/17/21 office consult note. Patient reports Trulance working as well for constipation as the Linzess had (changed for insurance reason)  ROS: Patient denies chest pain or cough   Past Medical History: Past Medical History:  Diagnosis Date   Allergy    Anemia    Arthritis    Ear infection    GERD (gastroesophageal reflux disease)    IBS (irritable bowel syndrome)    Migraine    SVT (supraventricular tachycardia) ([facility])    TMJ (dislocation of temporomandibular joint)      Past Surgical History: Past Surgical History:  Procedure Laterality Date   BACK SURGERY  03/24/2019   LAPAROSCOPIC TOTAL HYSTERECTOMY     TYMPANOSTOMY TUBE PLACEMENT Right    UPPER GASTROINTESTINAL ENDOSCOPY      Allergies: Allergies  Allergen Reactions   Amoxicillin     "I get UTIs"    Outpatient Meds: Current Outpatient Medications  Medication Sig Dispense Refill   celecoxib (CELEBREX) 200 MG capsule Take 1 capsule (200 mg total) by mouth 2 (two) times daily. 60 capsule 5   cyclobenzaprine (FLEXERIL) 10 MG tablet TAKE 1 TABLET (10 MG TOTAL) BY MOUTH AS NEEDED FOR MUSCLE SPASMS. 30 tablet 2   metoprolol tartrate (LOPRESSOR) 50 MG tablet Take 50 mg by mouth 2 (two) times daily.     Plecanatide (TRULANCE) 3 MG TABS Take 1 tablet by mouth in the morning. 30 tablet 3   tirzepatide (MOUNJARO) 7.5 MG/0.5ML Pen Inject 7.5 mg into the skin once a week. 6 mL 0   ciprofloxacin-dexamethasone (CIPRODEX) OTIC suspension Place 4 drops into both ears 2 (two) times daily. 7.5 mL 0   hyoscyamine (ANASPAZ) 0.125 MG TBDP disintergrating tablet PLACE 1 TABLET (0.125 MG TOTAL) UNDER THE TONGUE EVERY 6 (SIX) HOURS AS NEEDED. 360 tablet 1   promethazine (PHENERGAN) 25 MG tablet Take 1 tablet (25 mg total) by mouth every 6 (six) hours as needed. 30 tablet 5    rizatriptan (MAXALT) 10 MG tablet Take 1 tablet (10 mg total) by mouth as needed for migraine. 10 tablet 5   Current Facility-Administered Medications  Medication Dose Route Frequency Provider Last Rate Last Admin   0.9 %  sodium chloride infusion  500 mL Intravenous Once Danis, Starr Lake III, MD          ___________________________________________________________________ Objective   Exam:  BP 129/65    Temp (!) 96.8 F (36 C) (Temporal)    Ht 5\' 5"  (1.651 m)    Wt 235 lb (106.6 kg)    BMI 39.11 kg/m   CV: RRR without murmur, S1/S2 Resp: clear to auscultation bilaterally, normal RR and effort noted GI: soft, no tenderness, with active bowel sounds.Limited by body habitus   Assessment: Encounter Diagnosis  Name Primary?   Chronic constipation Yes     Plan: Colonoscopy  The benefits and risks of the planned procedure were described in detail with the patient or (when appropriate) their health care proxy.  Risks were outlined as including, but not limited to, bleeding, infection, perforation, adverse medication reaction leading to cardiac or pulmonary decompensation, pancreatitis (if ERCP).  The limitation of incomplete mucosal visualization was also discussed.  No guarantees or warranties were given.    The patient is appropriate for an endoscopic procedure in the ambulatory setting.   - , MD

## 2021-08-01 NOTE — Op Note (Signed)
[facility] Patient Name: Tracy Wise Procedure Date: 08/01/2021 3:42 PM MRN: [id number] Endoscopist: Mallie Mussel L. Loletha Carrow , MD Age: 50 Referring MD:  Date of Birth: Aug 11, 1971 Gender: Female Account #: [id number] Procedure:                Colonoscopy Indications:              Constipation Medicines:                Monitored Anesthesia Care Procedure:                Pre-Anesthesia Assessment:                           - Prior to the procedure, a History and Physical                            was performed, and patient medications and                            allergies were reviewed. The patient's tolerance of                            previous anesthesia was also reviewed. The risks                            and benefits of the procedure and the sedation                            options and risks were discussed with the patient.                            All questions were answered, and informed consent                            was obtained. Prior Anticoagulants: The patient has                            taken no previous anticoagulant or antiplatelet                            agents. ASA Grade Assessment: III - A patient with                            severe systemic disease. After reviewing the risks                            and benefits, the patient was deemed in                            satisfactory condition to undergo the procedure.                           After obtaining informed consent, the colonoscope  was passed under direct vision. Throughout the                            procedure, the patient's blood pressure, pulse, and                            oxygen saturations were monitored continuously. The                            CF [id number] was introduced through the anus                            and advanced to the the terminal ileum, with                            identification of the appendiceal orifice and IC                             valve. The colonoscopy was performed without                            difficulty. The patient tolerated the procedure                            well. The quality of the bowel preparation was                            excellent. The terminal ileum, ileocecal valve,                            appendiceal orifice, and rectum were photographed.                            The bowel preparation used was Plenvu. Scope In: 3:57:33 PM Scope Out: 4:08:45 PM Scope Withdrawal Time: 0 hours 9 minutes 6 seconds  Total Procedure Duration: 0 hours 11 minutes 12 seconds  Findings:                 The perianal and digital rectal examinations were                            normal.                           The entire examined colon appeared normal on direct                            and retroflexion views. Complications:            No immediate complications. Estimated Blood Loss:     Estimated blood loss: none. Impression:               - The entire examined colon is normal on direct and  retroflexion views.                           - No specimens collected. Recommendation:           - Patient has a contact number available for                            emergencies. The signs and symptoms of potential                            delayed complications were discussed with the                            patient. Return to normal activities tomorrow.                            Written discharge instructions were provided to the                            patient.                           - Resume previous diet.                           - Continue present medications.                           - Repeat colonoscopy in 10 years for screening                            purposes.                           - Return to my office as needed, especially if                            Trulance is no longer working well for your                             constipation. Jillayne Witte L. Myrtie Neither, MD 08/01/2021 4:12:28 PM This report has been signed electronically.

## 2021-08-01 NOTE — Progress Notes (Signed)
Sedate, gd SR, tolerated procedure well, VSS, report to RN 

## 2021-08-01 NOTE — Progress Notes (Signed)
No problems noted in the recovery room. maw 

## 2021-08-01 NOTE — Progress Notes (Signed)
VS completed by DT.   Medical and surgical history reviewed and updated.  

## 2021-08-05 ENCOUNTER — Telehealth: Payer: Self-pay | Admitting: *Deleted

## 2021-08-05 ENCOUNTER — Telehealth: Payer: Self-pay

## 2021-08-05 NOTE — Telephone Encounter (Signed)
°  Follow up Call-  Call back number 08/01/2021  Post procedure Call Back phone  # [phone]  Permission to leave phone message Yes  Some recent data might be hidden     Patient questions:  Do you have a fever, pain , or abdominal swelling? No. Pain Score  0 *  Have you tolerated food without any problems? Yes.    Have you been able to return to your normal activities? Yes.    Do you have any questions about your discharge instructions: Diet   No. Medications  No. Follow up visit  No.  Do you have questions or concerns about your Care? No.  Actions: * If pain score is 4 or above: No action needed, pain <4.  Have you developed a fever since your procedure? no  2.   Have you had an respiratory symptoms (SOB or cough) since your procedure? no  3.   Have you tested positive for COVID 19 since your procedure no  4.   Have you had any family members/close contacts diagnosed with the COVID 19 since your procedure?  no   If yes to any of these questions please route to Laverna Peace, RN and Karlton Lemon, RN

## 2021-08-05 NOTE — Telephone Encounter (Signed)
Message left

## 2021-08-11 ENCOUNTER — Other Ambulatory Visit: Payer: Self-pay | Admitting: Family Medicine

## 2021-08-13 ENCOUNTER — Other Ambulatory Visit: Payer: Self-pay | Admitting: Family Medicine

## 2021-08-25 ENCOUNTER — Other Ambulatory Visit: Payer: Self-pay | Admitting: Family Medicine

## 2021-09-05 ENCOUNTER — Telehealth: Payer: Self-pay | Admitting: *Deleted

## 2021-09-05 NOTE — Telephone Encounter (Signed)
PA form faxed to [facility] [phone] Rx [facility] Waiting for determination

## 2021-09-10 ENCOUNTER — Other Ambulatory Visit: Payer: Self-pay | Admitting: Family Medicine

## 2021-09-11 ENCOUNTER — Other Ambulatory Visit: Payer: Self-pay | Admitting: Family Medicine

## 2021-09-17 ENCOUNTER — Other Ambulatory Visit: Payer: Self-pay

## 2021-09-17 ENCOUNTER — Ambulatory Visit
Admission: RE | Admit: 2021-09-17 | Discharge: 2021-09-17 | Disposition: A | Discharge status: Home / Self Care | Payer: PRIVATE HEALTH INSURANCE | Source: Ambulatory Visit | Attending: Urgent Care | Admitting: Urgent Care

## 2021-09-17 VITALS — BP 124/79 | HR 81 | Temp 98.5°F | Resp 18

## 2021-09-17 DIAGNOSIS — J069 Acute upper respiratory infection, unspecified: Secondary | ICD-10-CM

## 2021-09-17 DIAGNOSIS — J3489 Other specified disorders of nose and nasal sinuses: Secondary | ICD-10-CM

## 2021-09-17 DIAGNOSIS — R062 Wheezing: Secondary | ICD-10-CM | POA: Diagnosis not present

## 2021-09-17 DIAGNOSIS — Z20828 Contact with and (suspected) exposure to other viral communicable diseases: Secondary | ICD-10-CM | POA: Diagnosis not present

## 2021-09-17 DIAGNOSIS — R0981 Nasal congestion: Secondary | ICD-10-CM | POA: Diagnosis not present

## 2021-09-17 DIAGNOSIS — I471 Supraventricular tachycardia: Secondary | ICD-10-CM

## 2021-09-17 MED ORDER — ALBUTEROL SULFATE HFA 108 (90 BASE) MCG/ACT IN AERS
1.0000 | INHALATION_SPRAY | Freq: Four times a day (QID) | RESPIRATORY_TRACT | 0 refills | Status: DC | PRN
Start: 2021-09-17 — End: 2022-06-11

## 2021-09-17 MED ORDER — PROMETHAZINE-DM 6.25-15 MG/5ML PO SYRP: 5.0000 mL | ORAL_SOLUTION | Freq: Every evening | ORAL | 0 refills | Status: DC | PRN

## 2021-09-17 MED ORDER — PREDNISONE 20 MG PO TABS: ORAL_TABLET | ORAL | 0 refills | Status: DC

## 2021-09-17 MED ORDER — BENZONATATE 100 MG PO CAPS: 100.0000 mg | ORAL_CAPSULE | Freq: Three times a day (TID) | ORAL | 0 refills | Status: DC | PRN

## 2021-09-17 MED ORDER — LEVOCETIRIZINE DIHYDROCHLORIDE 5 MG PO TABS: 5.0000 mg | ORAL_TABLET | Freq: Every evening | ORAL | 0 refills | Status: DC

## 2021-09-17 NOTE — ED Triage Notes (Signed)
Patient states that last Friday night she excessive nasal drainage  Patient states she has congestion and a stopped up nose  Patient states that she has been traveling on planes and it usually stops her up   Patient states she has tried OTC cold meds and her sisters' inhaler and it helped relieve some of her symptoms  Denies Fever

## 2021-09-17 NOTE — Discharge Instructions (Addendum)
We will notify you of your test results as they arrive and may take between 48-72 hours.  I encourage you to sign up for MyChart if you have not already done so as this can be the easiest way for [place] to communicate results to you online or through a phone app.  Generally, we only contact you if it is a positive test result.  In the meantime, if you develop worsening symptoms including fever, chest pain, shortness of breath despite our current treatment plan then please report to the emergency room as this may be a sign of worsening status from possible viral infection.  Otherwise, we will manage this as a viral syndrome. For sore throat or cough try using a honey-based tea. Use 3 teaspoons of honey with juice squeezed from half lemon. Place shaved pieces of ginger into 1/2-1 cup of water and warm over stove top. Then mix the ingredients and repeat every 4 hours as needed. Please take Tylenol 500mg -650mg  every 6 hours for aches and pains, fevers. Hydrate very well with at least 2 liters of water. Eat light meals such as soups to replenish electrolytes and soft fruits, veggies. Start an antihistamine like Zyrtec for postnasal drainage, sinus congestion.  You can take this together with the prednisone, a steroid, and the cough medications.

## 2021-09-17 NOTE — ED Provider Notes (Signed)
[facility]   MRN: [id number] DOB: 1971/06/22  Subjective:   Tracy Wise is a 51 y.o. female presenting for 5-day history of acute onset persistent sinus drainage, sinus pressure, throat pain, coughing, wheezing.  Patient has a history of COVID-19.  No history of asthma but since she had COVID, has had intermittent wheezing.  She is not a smoker.  No chest pain, shortness of breath, ear pain, ear drainage.  No current facility-administered medications for this encounter.  Current Outpatient Medications:    celecoxib (CELEBREX) 200 MG capsule, TAKE 1 CAPSULE BY MOUTH TWICE A DAY, Disp: 60 capsule, Rfl: 5   ciprofloxacin-dexamethasone (CIPRODEX) OTIC suspension, Place 4 drops into both ears 2 (two) times daily., Disp: 7.5 mL, Rfl: 0   cyclobenzaprine (FLEXERIL) 10 MG tablet, TAKE 1 TABLET (10 MG TOTAL) BY MOUTH AS NEEDED FOR MUSCLE SPASMS., Disp: 30 tablet, Rfl: 2   hyoscyamine (ANASPAZ) 0.125 MG TBDP disintergrating tablet, PLACE 1 TABLET (0.125 MG TOTAL) UNDER THE TONGUE EVERY 6 (SIX) HOURS AS NEEDED., Disp: 360 tablet, Rfl: 1   metoprolol tartrate (LOPRESSOR) 50 MG tablet, Take 50 mg by mouth 2 (two) times daily., Disp: , Rfl:    MOUNJARO 7.5 MG/0.5ML Pen, INJECT 1 PEN (7.5MG ) INTO THE SKIN ONCE A WEEK, Disp: 2 mL, Rfl: 0   Plecanatide (TRULANCE) 3 MG TABS, Take 1 tablet by mouth in the morning., Disp: 30 tablet, Rfl: 3   promethazine (PHENERGAN) 25 MG tablet, Take 1 tablet (25 mg total) by mouth every 6 (six) hours as needed., Disp: 30 tablet, Rfl: 5   rizatriptan (MAXALT) 10 MG tablet, TAKE 1 TABLET BY MOUTH AS NEEDED FOR MIGRAINE., Disp: 10 tablet, Rfl: 0   Allergies  Allergen Reactions   Amoxicillin     "I get UTIs"    Past Medical History:  Diagnosis Date   Allergy    Anemia    Arthritis    Ear infection    GERD (gastroesophageal reflux disease)    IBS (irritable bowel syndrome)    Migraine    SVT (supraventricular tachycardia) (HCC)    TMJ  (dislocation of temporomandibular joint)      Past Surgical History:  Procedure Laterality Date   BACK SURGERY  03/24/2019   LAPAROSCOPIC TOTAL HYSTERECTOMY     TYMPANOSTOMY TUBE PLACEMENT Right    UPPER GASTROINTESTINAL ENDOSCOPY      Family History  Problem Relation Age of Onset   Colon polyps Mother    Healthy Mother    Asthma Mother    Heart disease Father    Cancer Father    Rectal cancer Neg Hx    Stomach cancer Neg Hx     Social History   Tobacco Use   Smoking status: Never   Smokeless tobacco: Never  Vaping Use   Vaping Use: Never used  Substance Use Topics   Alcohol use: No   Drug use: No    ROS   Objective:   Vitals: BP 124/79 (BP Location: Right Arm)    Pulse 81    Temp 98.5 F (36.9 C) (Oral)    Resp 18    SpO2 95%   Physical Exam Constitutional:      General: She is not in acute distress.    Appearance: Normal appearance. She is well-developed. She is obese. She is not ill-appearing, toxic-appearing or diaphoretic.  HENT:     Head: Normocephalic and atraumatic.     Right Ear: Tympanic membrane, ear canal and external ear normal.  No drainage or tenderness. No middle ear effusion. There is no impacted cerumen. Tympanic membrane is not erythematous.     Left Ear: Tympanic membrane, ear canal and external ear normal. No drainage or tenderness.  No middle ear effusion. There is no impacted cerumen. Tympanic membrane is not erythematous.     Nose: Congestion present. No rhinorrhea.     Mouth/Throat:     Mouth: Mucous membranes are moist. No oral lesions.     Pharynx: No pharyngeal swelling, oropharyngeal exudate, posterior oropharyngeal erythema or uvula swelling.     Tonsils: No tonsillar exudate or tonsillar abscesses.     Comments: Streaks of postnasal drainage overlying pharynx. Eyes:     General: No scleral icterus.       Right eye: No discharge.        Left eye: No discharge.     Extraocular Movements: Extraocular movements intact.     Right  eye: Normal extraocular motion.     Left eye: Normal extraocular motion.     Conjunctiva/sclera: Conjunctivae normal.  Cardiovascular:     Rate and Rhythm: Normal rate.     Heart sounds: No murmur heard.   No friction rub. No gallop.  Pulmonary:     Effort: Pulmonary effort is normal. No respiratory distress.     Breath sounds: No stridor. No wheezing, rhonchi or rales.  Chest:     Chest wall: No tenderness.  Musculoskeletal:     Cervical back: Normal range of motion and neck supple.  Lymphadenopathy:     Cervical: No cervical adenopathy.  Skin:    General: Skin is warm and dry.  Neurological:     General: No focal deficit present.     Mental Status: She is alert and oriented to person, place, and time.  Psychiatric:        Mood and Affect: Mood normal.        Behavior: Behavior normal.    Assessment and Plan :   PDMP not reviewed this encounter.  1. Viral URI with cough   2. Exposure to the flu   3. Wheezing   4. Sinus congestion   5. Sinus pressure    Deferred imaging given clear cardiopulmonary exam, hemodynamically stable vital signs. COVID and flu test pending.  We will otherwise manage for viral upper respiratory infection.  Physical exam findings reassuring and vital signs stable for discharge. Advised supportive care, offered symptomatic relief. Counseled patient on potential for adverse effects with medications prescribed/recommended today, ER and return-to-clinic precautions discussed, patient verbalized understanding.      Wallis Bamberg, PA-C 09/17/21 1108

## 2021-09-18 LAB — COVID-19, FLU A+B NAA
Influenza A, NAA: NOT DETECTED
Influenza B, NAA: NOT DETECTED
SARS-CoV-2, NAA: NOT DETECTED

## 2021-09-28 ENCOUNTER — Other Ambulatory Visit: Payer: Self-pay | Admitting: Family Medicine

## 2021-10-02 ENCOUNTER — Other Ambulatory Visit: Payer: Self-pay | Admitting: Family Medicine

## 2021-10-09 ENCOUNTER — Other Ambulatory Visit: Payer: Self-pay | Admitting: Family Medicine

## 2021-11-20 ENCOUNTER — Other Ambulatory Visit: Payer: Self-pay | Admitting: *Deleted

## 2021-11-20 ENCOUNTER — Other Ambulatory Visit: Payer: Self-pay | Admitting: Family Medicine

## 2021-11-20 ENCOUNTER — Telehealth: Payer: Self-pay

## 2021-11-20 MED ORDER — TIRZEPATIDE 10 MG/0.5ML ~~LOC~~ SOAJ: 10.0000 mg | SUBCUTANEOUS | 1 refills | Status: DC

## 2021-11-20 NOTE — Telephone Encounter (Signed)
Pharmacy comment: Alternative Requested:WE CAN'T GET THE MOUNJARO 7.5MG  IN STOCK. CAN YOU PLEASE CHANGE THIS TO THE 5MG  OR 10MG . ?

## 2021-11-20 NOTE — Telephone Encounter (Signed)
Ok to send in 10mg  pen. She should take 10mg  once weekly.  ?

## 2021-11-20 NOTE — Telephone Encounter (Signed)
Please advise 

## 2021-11-20 NOTE — Telephone Encounter (Signed)
Ok to send in 10mg  per week dose. ? ? . Katina Degree, MD ?11/20/2021 1:50 PM  ? ?

## 2021-11-20 NOTE — Telephone Encounter (Addendum)
Patient has called in stating that pharmacy does not have 7.5 of Mounjaro.   Patient would like to know if Dr. Jimmey Ralph would send next dosage up?  Patient uses [facility] on [address] in  [place].    Please follow up at [phone].

## 2021-11-20 NOTE — Telephone Encounter (Signed)
Rx send to pharmacy  

## 2021-12-03 ENCOUNTER — Telehealth: Payer: Self-pay

## 2021-12-03 NOTE — Telephone Encounter (Signed)
Prior Authorization for [employer] submitted.  ? ?Approved today ?Your PA request has been approved.  ?Additional information will be provided in the approval communication ? ?

## 2022-01-01 ENCOUNTER — Other Ambulatory Visit: Payer: Self-pay | Admitting: Family Medicine

## 2022-01-11 ENCOUNTER — Other Ambulatory Visit: Payer: Self-pay | Admitting: Family Medicine

## 2022-01-18 ENCOUNTER — Encounter (HOSPITAL_COMMUNITY): Payer: Self-pay

## 2022-01-18 ENCOUNTER — Emergency Department (HOSPITAL_COMMUNITY): Payer: No Typology Code available for payment source

## 2022-01-18 ENCOUNTER — Emergency Department (HOSPITAL_COMMUNITY)
Admission: EM | Admit: 2022-01-18 | Discharge: 2022-01-18 | Disposition: A | Discharge status: Home / Self Care | Payer: No Typology Code available for payment source | Attending: Emergency Medicine | Admitting: Emergency Medicine

## 2022-01-18 ENCOUNTER — Other Ambulatory Visit: Payer: Self-pay

## 2022-01-18 DIAGNOSIS — R197 Diarrhea, unspecified: Secondary | ICD-10-CM | POA: Diagnosis not present

## 2022-01-18 DIAGNOSIS — E876 Hypokalemia: Secondary | ICD-10-CM | POA: Insufficient documentation

## 2022-01-18 DIAGNOSIS — R1013 Epigastric pain: Secondary | ICD-10-CM | POA: Insufficient documentation

## 2022-01-18 DIAGNOSIS — R0789 Other chest pain: Secondary | ICD-10-CM | POA: Insufficient documentation

## 2022-01-18 DIAGNOSIS — D72829 Elevated white blood cell count, unspecified: Secondary | ICD-10-CM | POA: Diagnosis not present

## 2022-01-18 DIAGNOSIS — R112 Nausea with vomiting, unspecified: Secondary | ICD-10-CM | POA: Diagnosis not present

## 2022-01-18 DIAGNOSIS — R739 Hyperglycemia, unspecified: Secondary | ICD-10-CM | POA: Insufficient documentation

## 2022-01-18 DIAGNOSIS — R1010 Upper abdominal pain, unspecified: Secondary | ICD-10-CM | POA: Diagnosis present

## 2022-01-18 LAB — CBC
HCT: 41 % (ref 36.0–46.0)
Hemoglobin: 13 g/dL (ref 12.0–15.0)
MCH: 26.1 pg (ref 26.0–34.0)
MCHC: 31.7 g/dL (ref 30.0–36.0)
MCV: 82.2 fL (ref 80.0–100.0)
Platelets: 267 10*3/uL (ref 150–400)
RBC: 4.99 MIL/uL (ref 3.87–5.11)
RDW: 14 % (ref 11.5–15.5)
WBC: 11.9 10*3/uL — ABNORMAL HIGH (ref 4.0–10.5)
nRBC: 0 % (ref 0.0–0.2)

## 2022-01-18 LAB — COMPREHENSIVE METABOLIC PANEL
ALT: 14 U/L (ref 0–44)
AST: 17 U/L (ref 15–41)
Albumin: 4.2 g/dL (ref 3.5–5.0)
Alkaline Phosphatase: 84 U/L (ref 38–126)
Anion gap: 7 (ref 5–15)
BUN: 9 mg/dL (ref 6–20)
CO2: 23 mmol/L (ref 22–32)
Calcium: 9.3 mg/dL (ref 8.9–10.3)
Chloride: 107 mmol/L (ref 98–111)
Creatinine, Ser: 0.66 mg/dL (ref 0.44–1.00)
GFR, Estimated: >60 mL/min (ref >60)
Glucose, Bld: 141 mg/dL — ABNORMAL HIGH (ref 70–99)
Potassium: 3.2 mmol/L — ABNORMAL LOW (ref 3.5–5.1)
Sodium: 137 mmol/L (ref 135–145)
Total Bilirubin: 0.8 mg/dL (ref 0.3–1.2)
Total Protein: 8 g/dL (ref 6.5–8.1)

## 2022-01-18 LAB — URINALYSIS, ROUTINE W REFLEX MICROSCOPIC
Bilirubin Urine: NEGATIVE
Glucose, UA: NEGATIVE mg/dL
Hgb urine dipstick: NEGATIVE
Ketones, ur: 5 mg/dL — AB
Leukocytes,Ua: NEGATIVE
Nitrite: NEGATIVE
Protein, ur: NEGATIVE mg/dL
Specific Gravity, Urine: >1.046 — ABNORMAL HIGH (ref 1.005–1.030)
pH: 8 (ref 5.0–8.0)

## 2022-01-18 LAB — TROPONIN I (HIGH SENSITIVITY)
Troponin I (High Sensitivity): <2 ng/L (ref <18)
Troponin I (High Sensitivity): 3 ng/L (ref <18)

## 2022-01-18 LAB — LIPASE, BLOOD: Lipase: 21 U/L (ref 11–51)

## 2022-01-18 MED ORDER — OMEPRAZOLE 20 MG PO CPDR: 20.0000 mg | DELAYED_RELEASE_CAPSULE | Freq: Every day | ORAL | 0 refills | Status: DC

## 2022-01-18 MED ORDER — SODIUM CHLORIDE 0.9 % IV BOLUS
1000.0000 mL | Freq: Once | INTRAVENOUS | Status: AC
  Administered 2022-01-18: 1000 mL via INTRAVENOUS

## 2022-01-18 MED ORDER — LIDOCAINE VISCOUS HCL 2 % MT SOLN
15.0000 mL | Freq: Once | OROMUCOSAL | Status: AC
Start: 2022-01-18 — End: 2022-01-18
  Administered 2022-01-18: 15 mL via ORAL
  Filled 2022-01-18: qty 15

## 2022-01-18 MED ORDER — POTASSIUM CHLORIDE 20 MEQ PO PACK
40.0000 meq | PACK | Freq: Once | ORAL | Status: AC
  Administered 2022-01-18: 40 meq via ORAL
  Filled 2022-01-18: qty 2

## 2022-01-18 MED ORDER — ALUM & MAG HYDROXIDE-SIMETH 200-200-20 MG/5ML PO SUSP
30.0000 mL | Freq: Once | ORAL | Status: AC
  Administered 2022-01-18: 30 mL via ORAL
  Filled 2022-01-18: qty 30

## 2022-01-18 MED ORDER — METOCLOPRAMIDE HCL 5 MG/ML IJ SOLN
5.0000 mg | Freq: Once | INTRAMUSCULAR | Status: AC
  Administered 2022-01-18: 5 mg via INTRAVENOUS
  Filled 2022-01-18: qty 2

## 2022-01-18 MED ORDER — IOHEXOL 300 MG/ML  SOLN
100.0000 mL | Freq: Once | INTRAMUSCULAR | Status: AC | PRN
  Administered 2022-01-18: 100 mL via INTRAVENOUS

## 2022-01-18 MED ORDER — LORAZEPAM 2 MG/ML IJ SOLN
1.0000 mg | Freq: Once | INTRAMUSCULAR | Status: AC
Start: 2022-01-18 — End: 2022-01-18
  Administered 2022-01-18: 1 mg via INTRAVENOUS
  Filled 2022-01-18: qty 1

## 2022-01-18 MED ORDER — METOCLOPRAMIDE HCL 5 MG/ML IJ SOLN
5.0000 mg | Freq: Once | INTRAMUSCULAR | Status: AC
Start: 2022-01-18 — End: 2022-01-18
  Administered 2022-01-18: 5 mg via INTRAVENOUS
  Filled 2022-01-18: qty 2

## 2022-01-18 MED ORDER — METOCLOPRAMIDE HCL 10 MG PO TABS: 10.0000 mg | ORAL_TABLET | Freq: Three times a day (TID) | ORAL | 0 refills | Status: DC | PRN

## 2022-01-18 MED ORDER — ONDANSETRON HCL 4 MG/2ML IJ SOLN
4.0000 mg | Freq: Once | INTRAMUSCULAR | Status: AC
  Administered 2022-01-18: 4 mg via INTRAVENOUS
  Filled 2022-01-18: qty 2

## 2022-01-18 MED ORDER — ONDANSETRON 4 MG PO TBDP
4.0000 mg | ORAL_TABLET | Freq: Once | ORAL | Status: AC | PRN
  Administered 2022-01-18: 4 mg via ORAL
  Filled 2022-01-18: qty 1

## 2022-01-18 MED ORDER — MORPHINE SULFATE (PF) 4 MG/ML IV SOLN
4.0000 mg | Freq: Once | INTRAVENOUS | Status: AC
  Administered 2022-01-18: 4 mg via INTRAVENOUS
  Filled 2022-01-18: qty 1

## 2022-01-18 NOTE — Discharge Instructions (Addendum)
I am prescribing you a medication called reglan.  This is a medicine you can use up to 3 times a day for management of your nausea and vomiting.  Please only take this as prescribed.  Please only take this if you are experiencing nausea and vomiting that you cannot control.  I am also prescribing you an antacid called omeprazole.  Please take it once per day for the next 2 weeks to see if this also helps improve your stomach pain.  If you develop any new or worsening symptoms please do not hesitate to return to the emergency department.  Please follow-up with your primary care provider regarding your symptoms as well as this visit today.

## 2022-01-18 NOTE — ED Notes (Signed)
Dr Denton Lank in to assess pt and discuss plan of care

## 2022-01-18 NOTE — ED Notes (Signed)
PA in to assess pt

## 2022-01-18 NOTE — ED Notes (Addendum)
Pt reports that the pain is better, but still has nausea. She is still unable to drink/take the potassium chloride drink.  Info relayed to PA, plan of care discussed

## 2022-01-18 NOTE — ED Provider Notes (Signed)
[facility] Provider Note   CSN: [id number] Arrival date & time: 01/18/22  1123     History  Chief Complaint  Patient presents with   Emesis    Tracy Wise is a 51 y.o. female.  HPI Patient is a 51 year old female with a history of prediabetes, obesity, who presents to the emergency department due to upper abdominal pain, chest pain, nausea, vomiting, diarrhea that started around 1 AM this morning.  Patient states she woke up with her symptoms.  Has had persistent nausea and vomiting since waking.  Denies any hematemesis or hematochezia.  States that the pain is in her upper abdomen and radiates into the central chest.  Unsure of what she ate for dinner last night.  Denies dysuria.    Home Medications Prior to Admission medications   Medication Sig Start Date End Date Taking? Authorizing Provider  albuterol (VENTOLIN HFA) 108 (90 Base) MCG/ACT inhaler Inhale 1-2 puffs into the lungs every 6 (six) hours as needed for wheezing or shortness of breath. 09/17/21  Yes Jaynee Eagles, PA-C  celecoxib (CELEBREX) 200 MG capsule TAKE 1 CAPSULE BY MOUTH TWICE A DAY 09/10/21  Yes Vivi Barrack, MD  cyclobenzaprine (FLEXERIL) 10 MG tablet TAKE 1 TABLET (10 MG TOTAL) BY MOUTH AS NEEDED FOR MUSCLE SPASMS. 11/20/21  Yes Vivi Barrack, MD  levocetirizine (XYZAL) 5 MG tablet Take 1 tablet (5 mg total) by mouth every evening. 09/17/21  Yes Jaynee Eagles, PA-C  metoCLOPramide (REGLAN) 10 MG tablet Take 1 tablet (10 mg total) by mouth every 8 (eight) hours as needed for nausea. 01/18/22  Yes Rayna Sexton, PA-C  metoprolol tartrate (LOPRESSOR) 50 MG tablet Take 50 mg by mouth 2 (two) times daily.   Yes [provider]  MOUNJARO 10 MG/0.5ML Pen INJECT 10 MG INTO THE SKIN ONE TIME PER WEEK 01/12/22  Yes Vivi Barrack, MD  omeprazole (PRILOSEC) 20 MG capsule Take 1 capsule (20 mg total) by mouth daily. 01/18/22  Yes Lacorey Brusca, PA-C  Plecanatide (TRULANCE) 3 MG TABS Take 1  tablet by mouth in the morning. Patient taking differently: Take 1 tablet by mouth every other day. 06/18/21  Yes Esterwood, Amy S, PA-C  promethazine (PHENERGAN) 25 MG tablet TAKE 1 TABLET (25 MG TOTAL) BY MOUTH EVERY 6 (SIX) HOURS AS NEEDED. 01/01/22  Yes Vivi Barrack, MD  rizatriptan (MAXALT) 10 MG tablet TAKE 1 TABLET BY MOUTH AS NEEDED FOR MIGRAINE. Patient taking differently: Take 5 mg by mouth as needed for migraine. 10/09/21  Yes Vivi Barrack, MD  benzonatate (TESSALON) 100 MG capsule Take 1-2 capsules (100-200 mg total) by mouth 3 (three) times daily as needed for cough. Patient not taking: Reported on 01/18/2022 09/17/21   Jaynee Eagles, PA-C  promethazine-dextromethorphan (PROMETHAZINE-DM) 6.25-15 MG/5ML syrup Take 5 mLs by mouth at bedtime as needed for cough. Patient not taking: Reported on 01/18/2022 09/17/21   Jaynee Eagles, PA-C      Allergies    Amoxicillin    Review of Systems   Review of Systems  All other systems reviewed and are negative. Ten systems reviewed and are negative for acute change, except as noted in the HPI.   Physical Exam Updated Vital Signs BP 134/69   Pulse 97   Temp 97.7 F (36.5 C) (Oral)   Resp 19   Ht 5\' 5"  (1.651 m)   Wt 108.9 kg   SpO2 94%   BMI 39.94 kg/m  Physical Exam Vitals and nursing  note reviewed.  Constitutional:      General: She is not in acute distress.    Appearance: Normal appearance. She is not ill-appearing, toxic-appearing or diaphoretic.  HENT:     Head: Normocephalic and atraumatic.     Right Ear: External ear normal.     Left Ear: External ear normal.     Nose: Nose normal.     Mouth/Throat:     Mouth: Mucous membranes are moist.     Pharynx: Oropharynx is clear.  Eyes:     General: No scleral icterus.       Right eye: No discharge.        Left eye: No discharge.     Extraocular Movements: Extraocular movements intact.     Conjunctiva/sclera: Conjunctivae normal.  Cardiovascular:     Rate and Rhythm: Normal rate  and regular rhythm.     Pulses: Normal pulses.     Heart sounds: Normal heart sounds. No murmur heard.   No friction rub. No gallop.  Pulmonary:     Effort: Pulmonary effort is normal. No respiratory distress.     Breath sounds: Normal breath sounds. No stridor. No wheezing, rhonchi or rales.     Comments: LCTAB.  Mild TTP noted along the sternal region. Chest:     Chest wall: Tenderness present.  Abdominal:     General: Abdomen is flat.     Palpations: Abdomen is soft.     Tenderness: There is abdominal tenderness.     Comments: Protuberant abdomen that is soft.  Moderate tenderness noted overlying the epigastrium.  Musculoskeletal:        General: Normal range of motion.     Cervical back: Normal range of motion and neck supple. No tenderness.  Skin:    General: Skin is warm and dry.  Neurological:     General: No focal deficit present.     Mental Status: She is alert and oriented to person, place, and time.  Psychiatric:        Mood and Affect: Mood normal.        Behavior: Behavior normal.   ED Results / Procedures / Treatments   Labs (all labs ordered are listed, but only abnormal results are displayed) Labs Reviewed  COMPREHENSIVE METABOLIC PANEL - Abnormal; Notable for the following components:      Result Value   Potassium 3.2 (*)    Glucose, Bld 141 (*)    All other components within normal limits  CBC - Abnormal; Notable for the following components:   WBC 11.9 (*)    All other components within normal limits  URINALYSIS, ROUTINE W REFLEX MICROSCOPIC - Abnormal; Notable for the following components:   Color, Urine STRAW (*)    Specific Gravity, Urine >1.046 (*)    Ketones, ur 5 (*)    All other components within normal limits  LIPASE, BLOOD  TROPONIN I (HIGH SENSITIVITY)  TROPONIN I (HIGH SENSITIVITY)   EKG EKG Interpretation  Date/Time:  Sunday January 18 2022 12:12:49 EDT Ventricular Rate:  89 PR Interval:  164 QRS Duration: 96 QT Interval:  384 QTC  Calculation: 467 R Axis:   -14 Text Interpretation: Normal sinus rhythm Incomplete right bundle branch block Non-specific ST-t changes No previous tracing Confirmed by Lajean Saver [phone]) on 01/18/2022 4:12:50 PM  Radiology CT ABDOMEN PELVIS W CONTRAST  Result Date: 01/18/2022 CLINICAL DATA:  Epigastric pain EXAM: CT ABDOMEN AND PELVIS WITH CONTRAST TECHNIQUE: Multidetector CT imaging of the abdomen and pelvis was  performed using the standard protocol following bolus administration of intravenous contrast. RADIATION DOSE REDUCTION: This exam was performed according to the departmental dose-optimization program which includes automated exposure control, adjustment of the mA and/or kV according to patient size and/or use of iterative reconstruction technique. CONTRAST:  19mL OMNIPAQUE IOHEXOL 300 MG/ML  SOLN COMPARISON:  None Available. FINDINGS: Lower chest: No acute abnormality. Hepatobiliary: Focal fatty deposition adjacent to the falciform ligament. Gallbladder is unremarkable. Portal vein is patent. No intrahepatic or extrahepatic biliary ductal dilation. Pancreas: Unremarkable. No pancreatic ductal dilatation or surrounding inflammatory changes. Spleen: Normal in size without focal abnormality. Adrenals/Urinary Tract: Adrenal glands are unremarkable. Multiple bilateral nonobstructing nephrolithiasis. Kidneys enhance symmetrically. RIGHT-sided renal cysts (for which no dedicated imaging follow-up is recommended). Bladder is unremarkable. Stomach/Bowel: No evidence of bowel obstruction. Appendix is normal. There is mild prominence of the stomach walls near the region of the pylorus. Vascular/Lymphatic: No significant vascular findings are present. No enlarged abdominal or pelvic lymph nodes. Reproductive: Status post hysterectomy. No adnexal masses. Other: Fat containing ventral hernia in the midline above the level of the umbilicus. There is a small amount of fat stranding. No free air or free fluid.  Musculoskeletal: Status post posterior fixation of L5-S1. 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 IMPRESSION: 1. Mild prominence of the gastric walls. This could be accentuated by under distension findings could reflect a nonspecific gastritis in the appropriate clinical setting. 2. Small fat containing ventral hernia with small amount of adjacent fat stranding. Electronically Signed   By: Valentino Saxon M.D.   On: 01/18/2022 15:06   DG Chest Portable 1 View  Result Date: 01/18/2022 CLINICAL DATA:  CP EXAM: PORTABLE CHEST 1 VIEW COMPARISON:  None Available. FINDINGS: The cardiomediastinal silhouette is mildly enlarged in contour. No pleural effusion. No pneumothorax. Scattered linear opacities consistent with atelectasis. Visualized abdomen is unremarkable. IMPRESSION: Scattered atelectasis. Electronically Signed   By: Valentino Saxon M.D.   On: 01/18/2022 14:22    Procedures Procedures   Medications Ordered in ED Medications  metoCLOPramide (REGLAN) injection 5 mg (has no administration in time range)  ondansetron (ZOFRAN-ODT) disintegrating tablet 4 mg (4 mg Oral Given 01/18/22 1225)  sodium chloride 0.9 % bolus 1,000 mL (0 mLs Intravenous Stopped 01/18/22 1430)  morphine (PF) 4 MG/ML injection 4 mg (4 mg Intravenous Given 01/18/22 1344)  alum & mag hydroxide-simeth (MAALOX/MYLANTA) 200-200-20 MG/5ML suspension 30 mL (30 mLs Oral Given 01/18/22 1347)    And  lidocaine (XYLOCAINE) 2 % viscous mouth solution 15 mL (15 mLs Oral Given 01/18/22 1346)  ondansetron (ZOFRAN) injection 4 mg (4 mg Intravenous Given 01/18/22 1340)  iohexol (OMNIPAQUE) 300 MG/ML solution 100 mL (100 mLs Intravenous Contrast Given 01/18/22 1422)  potassium chloride (KLOR-CON) packet 40 mEq (40 mEq Oral Given 01/18/22 1600)  metoCLOPramide (REGLAN) injection 5 mg (5 mg Intravenous Given 01/18/22 1622)  LORazepam (ATIVAN) injection 1 mg (1 mg Intravenous Given 01/18/22 1858)   ED Course/ Medical Decision Making/ A&P                             Medical Decision Making Amount and/or Complexity of Data Reviewed Labs: ordered. Radiology: ordered.  Risk OTC drugs. Prescription drug management.   Pt is a 51 y.o. female who presents to the emergency department due to upper abdominal pain, nausea, vomiting, diarrhea.  States that she ate old taco meat last night before the onset of her symptoms.  Labs: CBC with a  white count of 11.9. CMP with potassium of 3.2 and a glucose of 141. Lipase of 21. Troponin less than 2 with a repeat of 3. UA with 5 ketones.  Imaging: Chest x-ray shows scattered atelectasis.  CT scan of the abdomen/pelvis shows mild prominence of the gastric walls.  This could be accentuated by underdistention findings could reflect a nonspecific gastritis in the appropriate clinical setting.  Small fat-containing ventral hernia with a small amount of adjacent fat stranding.  I, Rayna Sexton, PA-C, personally reviewed and evaluated these images and lab results as part of my medical decision-making.  On my exam heart is regular rate and rhythm without murmurs, rubs, or gallops.  Lungs are clear to auscultation bilaterally.  Protuberant abdomen that is soft.  Moderate tenderness noted overlying the sternal region as well as the epigastrium.  After further discussion patient notes that she ate old taco meat last night prior to the onset of her symptoms.  Basic labs were obtained with findings as noted above.  Leukocytosis of 11.9.  Glucose of 141.  Potassium of 3.2.  Repleted with Klor-Con.  Lipase within normal limits at 21.  Troponins reassuring.  ECG does not appear ischemic.  Chest x-ray shows scattered atelectasis.  Does not appear consistent with ACS at this time.  CT scan obtained of the abdomen/pelvis with findings as noted above.  Concerning for possible gastritis which is consistent with the patient's symptoms.  On reassessment after patient was given IV fluids, Zofran, GI cocktail, as well as IV  morphine she notes moderate improvement.  No further nausea or pain.  She was p.o. challenged successfully.  She appears stable for discharge at this time and she is agreeable.  Will discharge on a short course of reglan for breakthrough nausea and vomiting.  We will also discharge on a 2-week course of omeprazole.  We discussed return precautions.  Her questions were answered and she was amicable at the time of discharge.  Note: Portions of this report may have been transcribed using voice recognition software. Every effort was made to ensure accuracy; however, inadvertent computerized transcription errors may be present.   Final Clinical Impression(s) / ED Diagnoses Final diagnoses:  Nausea vomiting and diarrhea  Epigastric pain   Rx / DC Orders ED Discharge Orders          Ordered    metoCLOPramide (REGLAN) 10 MG tablet  Every 8 hours PRN        01/18/22 2040    omeprazole (PRILOSEC) 20 MG capsule  Daily        01/18/22 2040              Rayna Sexton, PA-C 01/18/22 2051    Milton Ferguson, MD 01/20/22 1010

## 2022-01-18 NOTE — ED Triage Notes (Addendum)
Pt presents with upper abd pain and N/V/D that started at 0100 this AM. Pt has attempted to take PO phenergan, but vomited it back up. Pt states the pain goes up into her chest. Pt states she ate some taco meat last night that had been sitting out un-refrigerated all day.

## 2022-01-18 NOTE — ED Notes (Signed)
Pt unable to take potassium chloride at this time; she is starting to feel queezy again; a little nausea and burning in chest. Pt is also unable to urinate- unable to give urine sample.

## 2022-01-19 ENCOUNTER — Ambulatory Visit
Admission: EM | Admit: 2022-01-19 | Discharge: 2022-01-19 | Disposition: A | Discharge status: Home / Self Care | Payer: No Typology Code available for payment source | Attending: Nurse Practitioner | Admitting: Nurse Practitioner

## 2022-01-19 ENCOUNTER — Encounter (HOSPITAL_COMMUNITY): Payer: Self-pay | Admitting: Emergency Medicine

## 2022-01-19 ENCOUNTER — Emergency Department (HOSPITAL_COMMUNITY)
Admission: EM | Admit: 2022-01-19 | Discharge: 2022-01-20 | Disposition: A | Discharge status: Home / Self Care | Payer: No Typology Code available for payment source | Attending: Emergency Medicine | Admitting: Emergency Medicine

## 2022-01-19 ENCOUNTER — Other Ambulatory Visit: Payer: Self-pay

## 2022-01-19 DIAGNOSIS — R1013 Epigastric pain: Secondary | ICD-10-CM

## 2022-01-19 DIAGNOSIS — R112 Nausea with vomiting, unspecified: Secondary | ICD-10-CM | POA: Insufficient documentation

## 2022-01-19 DIAGNOSIS — D72829 Elevated white blood cell count, unspecified: Secondary | ICD-10-CM | POA: Diagnosis not present

## 2022-01-19 LAB — COMPREHENSIVE METABOLIC PANEL
ALT: 14 U/L (ref 0–44)
AST: 15 U/L (ref 15–41)
Albumin: 3.9 g/dL (ref 3.5–5.0)
Alkaline Phosphatase: 83 U/L (ref 38–126)
Anion gap: 9 (ref 5–15)
BUN: <5 mg/dL — ABNORMAL LOW (ref 6–20)
CO2: 25 mmol/L (ref 22–32)
Calcium: 9.1 mg/dL (ref 8.9–10.3)
Chloride: 103 mmol/L (ref 98–111)
Creatinine, Ser: 0.78 mg/dL (ref 0.44–1.00)
GFR, Estimated: >60 mL/min (ref >60)
Glucose, Bld: 104 mg/dL — ABNORMAL HIGH (ref 70–99)
Potassium: 4 mmol/L (ref 3.5–5.1)
Sodium: 137 mmol/L (ref 135–145)
Total Bilirubin: 0.5 mg/dL (ref 0.3–1.2)
Total Protein: 7.8 g/dL (ref 6.5–8.1)

## 2022-01-19 LAB — CBC
HCT: 42.1 % (ref 36.0–46.0)
Hemoglobin: 13.2 g/dL (ref 12.0–15.0)
MCH: 26.4 pg (ref 26.0–34.0)
MCHC: 31.4 g/dL (ref 30.0–36.0)
MCV: 84.2 fL (ref 80.0–100.0)
Platelets: 270 10*3/uL (ref 150–400)
RBC: 5 MIL/uL (ref 3.87–5.11)
RDW: 14.2 % (ref 11.5–15.5)
WBC: 11 10*3/uL — ABNORMAL HIGH (ref 4.0–10.5)
nRBC: 0 % (ref 0.0–0.2)

## 2022-01-19 LAB — URINALYSIS, ROUTINE W REFLEX MICROSCOPIC
Bilirubin Urine: NEGATIVE
Glucose, UA: NEGATIVE mg/dL
Hgb urine dipstick: NEGATIVE
Ketones, ur: 80 mg/dL — AB
Leukocytes,Ua: NEGATIVE
Nitrite: NEGATIVE
Protein, ur: 100 mg/dL — AB
Specific Gravity, Urine: 1.02 (ref 1.005–1.030)
pH: 7 (ref 5.0–8.0)

## 2022-01-19 LAB — LIPASE, BLOOD: Lipase: 21 U/L (ref 11–51)

## 2022-01-19 MED ORDER — ONDANSETRON HCL 4 MG/2ML IJ SOLN
4.0000 mg | Freq: Once | INTRAMUSCULAR | Status: DC
Start: 2022-01-19 — End: 2022-01-19

## 2022-01-19 MED ORDER — ONDANSETRON 4 MG PO TBDP
4.0000 mg | ORAL_TABLET | Freq: Once | ORAL | Status: AC
  Administered 2022-01-19: 4 mg via ORAL
  Filled 2022-01-19: qty 1

## 2022-01-19 MED ORDER — HYDROMORPHONE HCL 1 MG/ML IJ SOLN
0.5000 mg | Freq: Once | INTRAMUSCULAR | Status: AC
  Administered 2022-01-20: 0.5 mg via INTRAVENOUS
  Filled 2022-01-19: qty 1

## 2022-01-19 MED ORDER — SODIUM CHLORIDE 0.9 % IV BOLUS
1000.0000 mL | Freq: Once | INTRAVENOUS | Status: AC
  Administered 2022-01-20: 1000 mL via INTRAVENOUS

## 2022-01-19 MED ORDER — ONDANSETRON 4 MG PO TBDP
4.0000 mg | ORAL_TABLET | Freq: Once | ORAL | Status: AC
  Administered 2022-01-19: 4 mg via ORAL

## 2022-01-19 NOTE — ED Provider Triage Note (Signed)
Emergency Medicine Provider Triage Evaluation Note  Tracy Wise , a 51 y.o. female  was evaluated in triage.  Pt complains of abdominal pain and vomiting.  Pt seen at [facility] yesterday  Review of Systems  Positive: diarrhea Negative: fever  Physical Exam  BP (!) 153/86 (BP Location: Right Arm)   Pulse 91   Temp 99.3 F (37.4 C) (Oral)   Resp 18   SpO2 100%  Gen:   Awake, no distress   Resp:  Normal effort  MSK:   Moves extremities without difficulty  Other:    Medical Decision Making  Medically screening exam initiated at 6:29 PM.  Appropriate orders placed.  Tracy Wise was informed that the remainder of the evaluation will be completed by another provider, this initial triage assessment does not replace that evaluation, and the importance of remaining in the ED until their evaluation is complete.     Fransico Meadow, [place] 01/19/22 1830

## 2022-01-19 NOTE — ED Triage Notes (Signed)
Pt reports epigastric pain and vomiting x 2 days. Reports she was seen at the ED last night and was discharge with medications, but she do not have any prescription for Zofran.

## 2022-01-19 NOTE — Discharge Instructions (Signed)
Please go to the Emergency Room immediately for further evaluation of the abdominal pain you are having

## 2022-01-19 NOTE — ED Triage Notes (Signed)
Patient complains of abdominal pain, diarrhea, and emesis since Saturday. Patient seen at [facility] yesterday for same and diagnosed with gastritis. Patient is alert, oriented, and in no apparent distress at this time.

## 2022-01-19 NOTE — ED Notes (Signed)
Pt's family member informed a staff member that pt was wet and needed to be changed. RN pulled pt back to triage, gave pt paper scrubs and brief to change into. Pt's mom asked about pt vomiting and had concerns about pt vomiting up her home meds. PA ordered zofran for N/V.

## 2022-01-19 NOTE — ED Provider Notes (Signed)
[facility]    CSN: [id number] Arrival date & time: 01/19/22  1430      History   Chief Complaint Chief Complaint  Patient presents with   Abdominal Pain   Emesis    HPI Tracy Wise is a 51 y.o. female.   Patient presents with daughter and mother for 2 days of severe epigastric abdominal pain, nausea and vomiting.  She was seen in the emergency room yesterday where she had a CT of her abdomen that showed gastritis.  She was discharged on Reglan and omeprazole, mother reports she vomited these medications at this morning.  Today, she reports her abdominal pain is a 10 out of 10 in the epigastric area.  She has not been able to eat or drink anything today and keep it down, she is actively vomiting in triage.  She denies vomiting blood, diarrhea, fever, rash, dysuria or urinary frequency or urgency.   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      Home Medications    Prior to Admission medications    Medication Sig Start Date End Date Taking? Authorizing Provider  albuterol (VENTOLIN HFA) 108 (90 Base) MCG/ACT inhaler Inhale 1-2 puffs into the lungs every 6 (six) hours as needed for wheezing or shortness of breath. 09/17/21   Wallis Bamberg, PA-C  benzonatate (TESSALON) 100 MG capsule Take 1-2 capsules (100-200 mg total) by mouth 3 (three) times daily as needed for cough. Patient not taking: Reported on 01/18/2022 09/17/21   Wallis Bamberg, PA-C  celecoxib (CELEBREX) 200 MG capsule TAKE 1 CAPSULE BY MOUTH TWICE A DAY 09/10/21   Ardith Dark, MD  cyclobenzaprine (FLEXERIL) 10 MG tablet TAKE 1 TABLET (10 MG TOTAL) BY MOUTH AS NEEDED FOR MUSCLE SPASMS. 11/20/21   Ardith Dark, MD  levocetirizine (XYZAL) 5 MG tablet Take 1 tablet (5 mg total) by mouth every evening. 09/17/21   Wallis Bamberg, PA-C  metoCLOPramide (REGLAN) 10 MG tablet Take 1 tablet (10 mg total) by mouth every 8 (eight) hours as needed for nausea. 01/18/22   Placido Sou, PA-C  metoprolol tartrate (LOPRESSOR) 50 MG tablet Take 50 mg by mouth 2 (two) times daily.    [provider]  MOUNJARO 10 MG/0.5ML Pen INJECT 10 MG INTO THE SKIN ONE TIME PER WEEK 01/12/22   Ardith Dark, MD  omeprazole (PRILOSEC) 20 MG capsule Take 1 capsule (20 mg total) by mouth daily.  01/18/22   Placido Sou, PA-C  Plecanatide (TRULANCE) 3 MG TABS Take 1 tablet by mouth in the morning. Patient taking differently: Take 1 tablet by mouth every other day. 06/18/21   Esterwood, Amy S, PA-C  promethazine (PHENERGAN) 25 MG tablet TAKE 1 TABLET (25 MG TOTAL) BY MOUTH EVERY 6 (SIX) HOURS AS NEEDED. 01/01/22   Ardith Dark, MD  promethazine-dextromethorphan (PROMETHAZINE-DM) 6.25-15 MG/5ML syrup Take 5 mLs by mouth at bedtime as needed for cough. Patient not taking: Reported on 01/18/2022 09/17/21   Wallis Bamberg, PA-C  rizatriptan (MAXALT) 10 MG tablet TAKE 1 TABLET BY MOUTH AS NEEDED FOR MIGRAINE. Patient taking differently: Take 5 mg by mouth as needed for migraine.  10/09/21   Ardith Dark, MD    Family History Family History  Problem Relation Age of Onset   Colon polyps Mother    Healthy Mother    Asthma Mother    Heart disease Father    Cancer Father    Rectal cancer Neg Hx    Stomach cancer Neg Hx     Social History Social History   Tobacco Use   Smoking status: Never   Smokeless tobacco: Never  Vaping Use   Vaping Use: Never used  Substance Use Topics   Alcohol use: No   Drug use: No     Allergies   Amoxicillin   Review of Systems Review of Systems Per HPI  Physical Exam Triage Vital Signs ED Triage Vitals  Enc Vitals Group     BP 01/19/22 1555 (!) 144/81     Pulse Rate 01/19/22 1555 84     Resp 01/19/22 1555 20     Temp 01/19/22 1555 99 F (37.2 C)     Temp Source 01/19/22 1555 Oral     SpO2 01/19/22 1555 98 %     Weight --      Height --      Head Circumference --      Peak Flow --      Pain Score 01/19/22 1554 10     Pain Loc --      Pain Edu? --      Excl. in GC? --    No data found.  Updated Vital Signs BP (!) 144/81 (BP Location: Right Arm)   Pulse 84   Temp 99 F (37.2 C) (Oral)   Resp 20   SpO2 98%   Visual Acuity Right Eye Distance:   Left Eye Distance:   Bilateral Distance:    Right Eye Near:   Left Eye Near:    Bilateral Near:     Physical Exam Vitals and nursing note reviewed.  Constitutional:      General: She is not in acute distress.    Appearance: Normal appearance. She is obese. She is ill-appearing. She is not toxic-appearing.  HENT:     Head: Normocephalic and atraumatic.     Mouth/Throat:     Mouth: Mucous membranes are dry.  Eyes:     General: No scleral icterus.    Extraocular Movements: Extraocular movements intact.  Cardiovascular:     Rate and Rhythm: Regular rhythm. Tachycardia present.  Pulmonary:     Effort: Pulmonary effort is normal. No respiratory distress.     Breath sounds: Normal breath sounds. No wheezing, rhonchi or rales.  Abdominal:      General: Abdomen is protuberant. Bowel sounds are normal. There is no distension.     Palpations: Abdomen is soft.  Tenderness: There is abdominal tenderness in the epigastric area. There is guarding. There is no rebound.  Musculoskeletal:     Cervical back: Normal range of motion.  Lymphadenopathy:     Cervical: No cervical adenopathy.  Skin:    General: Skin is warm and dry.     Capillary Refill: Capillary refill takes less than 2 seconds.     Coloration: Skin is not jaundiced or pale.     Findings: No erythema.  Neurological:     Mental Status: She is alert and oriented to person, place, and time.     Gait: Gait abnormal (Patient sitting in wheelchair rocking back and forth; normally she is ambulatory).  Psychiatric:        Behavior: Behavior is cooperative.     UC Treatments / Results  Labs (all labs ordered are listed, but only abnormal results are displayed) Labs Reviewed - No data to display  EKG   Radiology CT ABDOMEN PELVIS W CONTRAST  Result Date: 01/18/2022 CLINICAL DATA:  Epigastric pain EXAM: CT ABDOMEN AND PELVIS WITH CONTRAST TECHNIQUE: Multidetector CT imaging of the abdomen and pelvis was performed using the standard protocol following bolus administration of intravenous contrast. RADIATION DOSE REDUCTION: This exam was performed according to the departmental dose-optimization program which includes automated exposure control, adjustment of the mA and/or kV according to patient size and/or use of iterative reconstruction technique. CONTRAST:  OMNIPAQUE IOHEXOL 300 MG/ML  SOLN COMPARISON:  None Available. FINDINGS: Lower chest: No acute abnormality. Hepatobiliary: Focal fatty deposition adjacent to the falciform ligament. Gallbladder is unremarkable. Portal vein is patent. No intrahepatic or extrahepatic biliary ductal dilation. Pancreas: Unremarkable. No pancreatic ductal dilatation or surrounding inflammatory changes. Spleen: Normal in size without focal  abnormality. Adrenals/Urinary Tract: Adrenal glands are unremarkable. Multiple bilateral nonobstructing nephrolithiasis. Kidneys enhance symmetrically. RIGHT-sided renal cysts (for which no dedicated imaging follow-up is recommended). Bladder is unremarkable. Stomach/Bowel: No evidence of bowel obstruction. Appendix is normal. There is mild prominence of the stomach walls near the region of the pylorus. Vascular/Lymphatic: No significant vascular findings are present. No enlarged abdominal or pelvic lymph nodes. Reproductive: Status post hysterectomy. No adnexal masses. Other: Fat containing ventral hernia in the midline above the level of the umbilicus. There is a small amount of fat stranding. No free air or free fluid. Musculoskeletal: Status post posterior fixation of L5-S1. 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 IMPRESSION: 1. Mild prominence of the gastric walls. This could be accentuated by under distension findings could reflect a nonspecific gastritis in the appropriate clinical setting. 2. Small fat containing ventral hernia with small amount of adjacent fat stranding. Electronically Signed   By: Meda Klinefelter M.D.   On: 01/18/2022 15:06   DG Chest Portable 1 View  Result Date: 01/18/2022 CLINICAL DATA:  CP EXAM: PORTABLE CHEST 1 VIEW COMPARISON:  None Available. FINDINGS: The cardiomediastinal silhouette is mildly enlarged in contour. No pleural effusion. No pneumothorax. Scattered linear opacities consistent with atelectasis. Visualized abdomen is unremarkable. IMPRESSION: Scattered atelectasis. Electronically Signed   By: Meda Klinefelter M.D.   On: 01/18/2022 14:22    Procedures Procedures (including critical care time)  Medications Ordered in UC Medications  ondansetron (ZOFRAN-ODT) disintegrating tablet 4 mg (4 mg Oral Given 01/19/22 1619)    Initial Impression / Assessment and Plan / UC Course  I have reviewed the triage vital signs and the nursing notes.  Pertinent labs &  imaging results that were available during my care of the patient were reviewed by  me and considered in my medical decision making (see chart for details).    We attempted to treat her nausea with Zofran ODT 4 mg and trial fluid challenge, however patient did not pass this successfully.  I advised her to go immediately to the emergency room for further evaluation and work-up given her significant pain, inability to tolerate oral fluids.  Patient was given the option to ask questions and all questions were answered to the best my ability.  Patient, daughter, and mother, verbalized understanding of the plan. Final Clinical Impressions(s) / UC Diagnoses   Final diagnoses:  Epigastric pain     Discharge Instructions      Please go to the Emergency Room immediately for further evaluation of the abdominal pain you are having     ED Prescriptions   None    PDMP not reviewed this encounter.   Valentino NoseMartinez, Ginamarie Banfield A, NP 01/19/22 [id number]

## 2022-01-20 ENCOUNTER — Emergency Department (HOSPITAL_COMMUNITY): Payer: No Typology Code available for payment source

## 2022-01-20 ENCOUNTER — Other Ambulatory Visit: Payer: Self-pay

## 2022-01-20 ENCOUNTER — Encounter (HOSPITAL_COMMUNITY): Payer: Self-pay

## 2022-01-20 MED ORDER — SUCRALFATE 1 GM/10ML PO SUSP: 1.0000 g | Freq: Three times a day (TID) | ORAL | 0 refills | Status: DC

## 2022-01-20 MED ORDER — ONDANSETRON 4 MG PO TBDP: 4.0000 mg | ORAL_TABLET | Freq: Three times a day (TID) | ORAL | 0 refills | Status: DC | PRN

## 2022-01-20 MED ORDER — IOHEXOL 300 MG/ML  SOLN
100.0000 mL | Freq: Once | INTRAMUSCULAR | Status: AC | PRN
Start: 2022-01-20 — End: 2022-01-20
  Administered 2022-01-20: 100 mL via INTRAVENOUS

## 2022-01-20 MED ORDER — DICYCLOMINE HCL 10 MG PO CAPS
10.0000 mg | ORAL_CAPSULE | Freq: Once | ORAL | Status: AC
  Administered 2022-01-20: 10 mg via ORAL
  Filled 2022-01-20: qty 1

## 2022-01-20 MED ORDER — PANTOPRAZOLE SODIUM 40 MG IV SOLR
40.0000 mg | Freq: Once | INTRAVENOUS | Status: AC
Start: 2022-01-20 — End: 2022-01-20
  Administered 2022-01-20: 40 mg via INTRAVENOUS
  Filled 2022-01-20: qty 10

## 2022-01-20 MED ORDER — SUCRALFATE 1 G PO TABS
1.0000 g | ORAL_TABLET | Freq: Once | ORAL | Status: AC
  Administered 2022-01-20: 1 g via ORAL
  Filled 2022-01-20: qty 1

## 2022-01-20 MED ORDER — SODIUM CHLORIDE 0.9 % IV BOLUS: 500.0000 mL | Freq: Once | INTRAVENOUS | Status: DC

## 2022-01-20 MED ORDER — HYDROMORPHONE HCL 1 MG/ML IJ SOLN: 1.0000 mg | Freq: Once | INTRAMUSCULAR | Status: DC

## 2022-01-20 MED ORDER — FENTANYL CITRATE PF 50 MCG/ML IJ SOSY
50.0000 ug | PREFILLED_SYRINGE | Freq: Once | INTRAMUSCULAR | Status: AC
  Administered 2022-01-20: 50 ug via INTRAVENOUS
  Filled 2022-01-20: qty 1

## 2022-01-20 MED ORDER — DROPERIDOL 2.5 MG/ML IJ SOLN
1.2500 mg | Freq: Once | INTRAMUSCULAR | Status: AC
  Administered 2022-01-20: 1.25 mg via INTRAVENOUS
  Filled 2022-01-20: qty 2

## 2022-01-20 NOTE — ED Provider Notes (Signed)
[name] EMERGENCY DEPARTMENT Provider Note   CSN: [id number] Arrival date & time: 01/19/22  1728     History  Chief Complaint  Patient presents with   Abdominal Pain    Tracy Wise is a 51 y.o. female.  The history is provided by the patient and medical records.  Abdominal Pain Tracy Wise is a 51 y.o. female who presents to the Emergency Department complaining of abdominal pain.  She presents to the emergency department accompanied by family for evaluation of severe epigastric pain and vomiting.  She started to feel poorly Saturday night.  She reports epigastric pain with associated diarrhea.  She was evaluated in the emergency department and treated.  She later developed nausea and vomiting.  She reports numerous episodes of emesis.  Overall her diarrhea has resolved.  No fevers, chest pain.  She has been unable to tolerate her oral medications secondary to symptoms.    Home Medications Prior to Admission medications   Medication Sig Start Date End Date Taking? Authorizing Provider  ondansetron (ZOFRAN-ODT) 4 MG disintegrating tablet Take 1 tablet (4 mg total) by mouth every 8 (eight) hours as needed for nausea or vomiting. 01/20/22  Yes Quintella Reichert, MD  sucralfate (CARAFATE) 1 GM/10ML suspension Take 10 mLs (1 g total) by mouth 4 (four) times daily -  with meals and at bedtime. 01/20/22  Yes Quintella Reichert, MD  albuterol (VENTOLIN HFA) 108 (90 Base) MCG/ACT inhaler Inhale 1-2 puffs into the lungs every 6 (six) hours as needed for wheezing or shortness of breath. 09/17/21   Jaynee Eagles, PA-C  benzonatate (TESSALON) 100 MG capsule Take 1-2 capsules (100-200 mg total) by mouth 3 (three) times daily as needed for cough. Patient not taking: Reported on 01/18/2022 09/17/21   Jaynee Eagles, PA-C  celecoxib (CELEBREX) 200 MG capsule TAKE 1 CAPSULE BY MOUTH TWICE A DAY 09/10/21   Vivi Barrack, MD  cyclobenzaprine (FLEXERIL) 10 MG tablet TAKE 1 TABLET (10 MG TOTAL) BY  MOUTH AS NEEDED FOR MUSCLE SPASMS. 11/20/21   Vivi Barrack, MD  levocetirizine (XYZAL) 5 MG tablet Take 1 tablet (5 mg total) by mouth every evening. 09/17/21   Jaynee Eagles, PA-C  metoprolol tartrate (LOPRESSOR) 50 MG tablet Take 50 mg by mouth 2 (two) times daily.    [provider]  MOUNJARO 10 MG/0.5ML Pen INJECT 10 MG INTO THE SKIN ONE TIME PER WEEK 01/12/22   Vivi Barrack, MD  omeprazole (PRILOSEC) 20 MG capsule Take 1 capsule (20 mg total) by mouth daily. 01/18/22   Rayna Sexton, PA-C  Plecanatide (TRULANCE) 3 MG TABS Take 1 tablet by mouth in the morning. Patient taking differently: Take 1 tablet by mouth every other day. 06/18/21   Esterwood, Amy S, PA-C  promethazine (PHENERGAN) 25 MG tablet TAKE 1 TABLET (25 MG TOTAL) BY MOUTH EVERY 6 (SIX) HOURS AS NEEDED. 01/01/22   Vivi Barrack, MD  promethazine-dextromethorphan (PROMETHAZINE-DM) 6.25-15 MG/5ML syrup Take 5 mLs by mouth at bedtime as needed for cough. Patient not taking: Reported on 01/18/2022 09/17/21   Jaynee Eagles, PA-C  rizatriptan (MAXALT) 10 MG tablet TAKE 1 TABLET BY MOUTH AS NEEDED FOR MIGRAINE. Patient taking differently: Take 5 mg by mouth as needed for migraine. 10/09/21   Vivi Barrack, MD      Allergies    Amoxicillin    Review of Systems   Review of Systems  Gastrointestinal:  Positive for abdominal pain.  All other systems reviewed and  are negative.  Physical Exam Updated Vital Signs BP 131/75   Pulse (!) 103   Temp 99.3 F (37.4 C) (Oral)   Resp 18   Ht 5\' 5"  (1.651 m)   Wt 108.9 kg   SpO2 100%   BMI 39.95 kg/m  Physical Exam Vitals and nursing note reviewed.  Constitutional:      General: She is in acute distress.     Appearance: She is well-developed.     Comments: Uncomfortable appearing  HENT:     Head: Normocephalic and atraumatic.  Cardiovascular:     Rate and Rhythm: Normal rate and regular rhythm.     Heart sounds: No murmur heard. Pulmonary:     Effort: Pulmonary effort is  normal. No respiratory distress.     Breath sounds: Normal breath sounds.  Abdominal:     Palpations: Abdomen is soft.     Tenderness: There is abdominal tenderness. There is no guarding or rebound.     Comments: Moderate epigastric tenderness  Musculoskeletal:        General: No swelling or tenderness.  Skin:    General: Skin is warm and dry.  Neurological:     Mental Status: She is alert and oriented to person, place, and time.  Psychiatric:        Behavior: Behavior normal.    ED Results / Procedures / Treatments   Labs (all labs ordered are listed, but only abnormal results are displayed) Labs Reviewed  COMPREHENSIVE METABOLIC PANEL - Abnormal; Notable for the following components:      Result Value   Glucose, Bld 104 (*)    BUN <5 (*)    All other components within normal limits  CBC - Abnormal; Notable for the following components:   WBC 11.0 (*)    All other components within normal limits  URINALYSIS, ROUTINE W REFLEX MICROSCOPIC - Abnormal; Notable for the following components:   Ketones, ur 80 (*)    Protein, ur 100 (*)    Bacteria, UA RARE (*)    All other components within normal limits  LIPASE, BLOOD    EKG None  Radiology CT Abdomen Pelvis W Contrast  Result Date: 01/20/2022 CLINICAL DATA:  Nausea and vomiting with acute abdominal pain EXAM: CT ABDOMEN AND PELVIS WITH CONTRAST TECHNIQUE: Multidetector CT imaging of the abdomen and pelvis was performed using the standard protocol following bolus administration of intravenous contrast. RADIATION DOSE REDUCTION: This exam was performed according to the departmental dose-optimization program which includes automated exposure control, adjustment of the mA and/or kV according to patient size and/or use of iterative reconstruction technique. CONTRAST:  120mL OMNIPAQUE IOHEXOL 300 MG/ML  SOLN COMPARISON:  01/18/2022 FINDINGS: Lower Chest: Normal. Hepatobiliary: Normal hepatic contours. No intra- or extrahepatic biliary  dilatation. Vicarious excretion of contrast media into the gallbladder. Pancreas: Normal pancreas. No ductal dilatation or peripancreatic fluid collection. Spleen: Normal. Adrenals/Urinary Tract: The adrenal glands are normal. Nonobstructive left nephrolithiasis. Unchanged appearance simple right renal cysts (Bosniak class 1). The urinary bladder is normal for degree of distention Stomach/Bowel: There is no hiatal hernia. Normal duodenal course and caliber. No small bowel dilatation or inflammation. No focal colonic abnormality. Normal appendix. Vascular/Lymphatic: Normal course and caliber of the major abdominal vessels. No abdominal or pelvic lymphadenopathy. Reproductive: Status post hysterectomy. No adnexal mass. Other: Small fat containing ventral abdominal hernia. Musculoskeletal: L5-S1 PLIF with grade 1 anterolisthesis. IMPRESSION: 1. No acute abnormality of the abdomen or pelvis. 2. Nonobstructive left nephrolithiasis. Electronically Signed  By: Ulyses Jarred M.D.   On: 01/20/2022 02:43   CT ABDOMEN PELVIS W CONTRAST  Result Date: 01/18/2022 CLINICAL DATA:  Epigastric pain EXAM: CT ABDOMEN AND PELVIS WITH CONTRAST TECHNIQUE: Multidetector CT imaging of the abdomen and pelvis was performed using the standard protocol following bolus administration of intravenous contrast. RADIATION DOSE REDUCTION: This exam was performed according to the departmental dose-optimization program which includes automated exposure control, adjustment of the mA and/or kV according to patient size and/or use of iterative reconstruction technique. CONTRAST:  153mL OMNIPAQUE IOHEXOL 300 MG/ML  SOLN COMPARISON:  None Available. FINDINGS: Lower chest: No acute abnormality. Hepatobiliary: Focal fatty deposition adjacent to the falciform ligament. Gallbladder is unremarkable. Portal vein is patent. No intrahepatic or extrahepatic biliary ductal dilation. Pancreas: Unremarkable. No pancreatic ductal dilatation or surrounding  inflammatory changes. Spleen: Normal in size without focal abnormality. Adrenals/Urinary Tract: Adrenal glands are unremarkable. Multiple bilateral nonobstructing nephrolithiasis. Kidneys enhance symmetrically. RIGHT-sided renal cysts (for which no dedicated imaging follow-up is recommended). Bladder is unremarkable. Stomach/Bowel: No evidence of bowel obstruction. Appendix is normal. There is mild prominence of the stomach walls near the region of the pylorus. Vascular/Lymphatic: No significant vascular findings are present. No enlarged abdominal or pelvic lymph nodes. Reproductive: Status post hysterectomy. No adnexal masses. Other: Fat containing ventral hernia in the midline above the level of the umbilicus. There is a small amount of fat stranding. No free air or free fluid. Musculoskeletal: Status post posterior fixation of L5-S1. 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 IMPRESSION: 1. Mild prominence of the gastric walls. This could be accentuated by under distension findings could reflect a nonspecific gastritis in the appropriate clinical setting. 2. Small fat containing ventral hernia with small amount of adjacent fat stranding. Electronically Signed   By: Valentino Saxon M.D.   On: 01/18/2022 15:06   DG Chest Portable 1 View  Result Date: 01/18/2022 CLINICAL DATA:  CP EXAM: PORTABLE CHEST 1 VIEW COMPARISON:  None Available. FINDINGS: The cardiomediastinal silhouette is mildly enlarged in contour. No pleural effusion. No pneumothorax. Scattered linear opacities consistent with atelectasis. Visualized abdomen is unremarkable. IMPRESSION: Scattered atelectasis. Electronically Signed   By: Valentino Saxon M.D.   On: 01/18/2022 14:22    Procedures Procedures    Medications Ordered in ED Medications  HYDROmorphone (DILAUDID) injection 1 mg (0 mg Intravenous Hold 01/20/22 0110)  sodium chloride 0.9 % bolus 500 mL (500 mLs Intravenous Not Given 01/20/22 0208)  sodium chloride 0.9 % bolus 1,000 mL  (1,000 mLs Intravenous New Bag/Given 01/20/22 0049)  HYDROmorphone (DILAUDID) injection 0.5 mg (0.5 mg Intravenous Given 01/20/22 0050)  ondansetron (ZOFRAN-ODT) disintegrating tablet 4 mg (4 mg Oral Given 01/19/22 2241)  droperidol (INAPSINE) 2.5 MG/ML injection 1.25 mg (1.25 mg Intravenous Given 01/20/22 0133)  pantoprazole (PROTONIX) injection 40 mg (40 mg Intravenous Given 01/20/22 0131)  iohexol (OMNIPAQUE) 300 MG/ML solution 100 mL (100 mLs Intravenous Contrast Given 01/20/22 0224)  fentaNYL (SUBLIMAZE) injection 50 mcg (50 mcg Intravenous Given 01/20/22 0717)  dicyclomine (BENTYL) capsule 10 mg (10 mg Oral Given 01/20/22 0717)  sucralfate (CARAFATE) tablet 1 g (1 g Oral Given 01/20/22 [id number])    ED Course/ Medical Decision Making/ A&P                           Medical Decision Making Amount and/or Complexity of Data Reviewed Labs: ordered. Radiology: ordered.  Risk Prescription drug management.   Patient with history of hypertension, IBS-C here for evaluation  of abdominal pain, vomiting.  She did have diarrhea, this is now resolved.  She was evaluated in any pain yesterday and had imaging and labs performed.  Since ED discharge she reports worsening pain as well as profuse and persistent vomiting.  Patient with tenderness on initial evaluation without peritoneal findings.  She was treated with IV fluids, antiemetics and pain control.  Labs with leukocytosis, similar when compared to priors.  On repeat evaluation after treatment patient does have some ongoing pain and nausea and a CT abdomen pelvis was obtained to rule out obstruction. Repeat CT is negative for acute abnormality.  On repeat assessment patient is feeling improved.  She is able to tolerate oral fluids.  She does have some mild recurrent pain.  She was retreated with additional medications and on reassessment is feeling improved.  Plan to discharge home with liquid diet.  We will change her home antiemetic.  Discussed close return precautions  for progressive or concerning symptoms.  Presentation is not consistent with SBO, pancreatitis, perforated viscus, sepsis.        Final Clinical Impression(s) / ED Diagnoses Final diagnoses:  Epigastric pain  Nausea and vomiting, unspecified vomiting type    Rx / DC Orders ED Discharge Orders          Ordered    ondansetron (ZOFRAN-ODT) 4 MG disintegrating tablet  Every 8 hours PRN        01/20/22 0735    sucralfate (CARAFATE) 1 GM/10ML suspension  3 times daily with meals & bedtime        01/20/22 0735              Quintella Reichert, MD 01/20/22 [id number]

## 2022-01-20 NOTE — ED Notes (Signed)
Pt ambulatory to [facility]

## 2022-01-20 NOTE — ED Notes (Signed)
Initiated PO challenge with fluids

## 2022-01-20 NOTE — Discharge Instructions (Signed)
Do not take your [facility] today.  Stop taking your Celebrex until your stomach pain is completely resolved.  Stop taking the Reglan (metoclopramide). Only drink clear fluids today.  You may slowly advance her diet over the next few days.  Get rechecked if you have uncontrolled pain, cannot keep down liquids or have new concerning symptoms.

## 2022-01-21 ENCOUNTER — Ambulatory Visit (INDEPENDENT_AMBULATORY_CARE_PROVIDER_SITE_OTHER): Payer: No Typology Code available for payment source | Admitting: Family

## 2022-01-21 ENCOUNTER — Telehealth: Payer: Self-pay | Admitting: Family Medicine

## 2022-01-21 ENCOUNTER — Encounter: Payer: Self-pay | Admitting: Family

## 2022-01-21 VITALS — BP 133/75 | HR 68 | Temp 97.9°F | Ht 65.0 in | Wt 240.5 lb

## 2022-01-21 DIAGNOSIS — R112 Nausea with vomiting, unspecified: Secondary | ICD-10-CM

## 2022-01-21 DIAGNOSIS — J00 Acute nasopharyngitis [common cold]: Secondary | ICD-10-CM | POA: Diagnosis not present

## 2022-01-21 DIAGNOSIS — L270 Generalized skin eruption due to drugs and medicaments taken internally: Secondary | ICD-10-CM

## 2022-01-21 MED ORDER — LEVOCETIRIZINE DIHYDROCHLORIDE 5 MG PO TABS: 5.0000 mg | ORAL_TABLET | Freq: Every evening | ORAL | 0 refills | Status: DC

## 2022-01-21 MED ORDER — ONDANSETRON 8 MG PO TBDP: 4.0000 mg | ORAL_TABLET | Freq: Three times a day (TID) | ORAL | 0 refills | Status: DC | PRN

## 2022-01-21 MED ORDER — HYDROXYZINE HCL 10 MG PO TABS: 10.0000 mg | ORAL_TABLET | Freq: Three times a day (TID) | ORAL | 0 refills | Status: DC | PRN

## 2022-01-21 MED ORDER — TRIAMCINOLONE ACETONIDE 0.1 % EX CREA: 1.0000 "application " | TOPICAL_CREAM | Freq: Two times a day (BID) | CUTANEOUS | 0 refills | Status: DC

## 2022-01-21 MED ORDER — METHYLPREDNISOLONE ACETATE 80 MG/ML IJ SUSP
80.0000 mg | Freq: Once | INTRAMUSCULAR | Status: AC
  Administered 2022-01-21: 80 mg via INTRAMUSCULAR

## 2022-01-21 NOTE — Telephone Encounter (Signed)
Pt called to schedule visit for possible medication allergic reaction. States following ED visit she was prescribed medication that seemingly caused a widespread rash. Rash was swollen, itchy, and warm to touch. Sent pt to triage for further evaluation.

## 2022-01-21 NOTE — Telephone Encounter (Signed)
See HCP within 4 Hours (or PCP triage)   Patient Name: Tracy Wise Gender: Female DOB: [birth year] Age: 51 Y 9 M 18 D Return Phone Number: [phone] (Secondary) [place] [address] Client [facility] - Administrator, sports at [facility] Provider [name]- MD Contact Type Call Who Is Calling Patient / Member / Family / Caregiver Call Type Triage / Clinical Relationship To Patient Self Return Phone Number [phone] (Secondary) Chief Complaint Rash - Widespread Reason for Call Symptomatic / Request for Health Information Initial Comment Has rash all over, warm to the touch, just started a new med. Translation No Nurse Assessment Nurse: Tresa Endo, RN, Kim Date/Time (Eastern Time): 01/21/2022 10:39:24 AM Confirm and document reason for call. If symptomatic, describe symptoms. ---Caller states she has been in the ER twice this week, once on Monday and once yesterday; has been given numerous medicines and now has a rash all over and feels warm. States she was given liquid lidocaine, protonix, zofran, and ativan during the first visit to the ER and rash started later that night. She was given omnipaque, dilaudid, fentanyl, bentyl, inopsine, carafate during 2nd ER visit and she had never had any of these meds before, and was given protonix and zofran again and the rash worsened. Benadryl only helps for a short time. Rash is very itchy, generalized, raised, small bumps. Does the patient have any new or worsening symptoms? ---Yes Will a triage be completed? ---Yes Related visit to physician within the last 2 weeks? ---Yes Does the PT have any chronic conditions? (i.e. diabetes, asthma, this includes High risk factors for pregnancy, etc.) ---Yes List chronic conditions. ---SVT, IBS-C Is the patient pregnant or possibly pregnant? (Ask all females between the ages of 68-55) ---No Is  this a behavioral health or substance abuse call? ---No  Guidelines Guideline Title Affirmed Question Affirmed Notes Nurse Date/Time (Eastern Time) Rash - Widespread On Drugs [1] Taking new prescription medication AND [2] rash within 4 hours of 1st dose Tresa Endo, RN, Selena Batten 01/21/2022 10:47:32 AM Disp. Time Lamount Cohen Time) Disposition Final User 01/21/2022 10:50:22 AM See HCP within 4 Hours (or PCP triage) Yes Tresa Endo, RN, Tami Lin Disagree/Comply Comply Caller Understands Yes PreDisposition Did not know what to do Care Advice Given Per Guideline SEE HCP (OR PCP TRIAGE) WITHIN 4 HOURS: * IF OFFICE WILL BE OPEN: You need to be seen within the next 3 or 4 hours. Call your doctor (or NP/PA) now or as soon as the office opens. STOP THE MEDICATION: * Stop the medicine until examined. ANTIHISTAMINE FOR SEVERE ITCHING: * Use diphenhydramine (over-the-counter Benadryl 50 mg PO) or any antihistamine medicine that you might have. CALL BACK IF: * You become worse CARE ADVICE given per Rash - Widespread on Drugs (Adult) guideline Comments User: Lovenia Shuck, RN Date/Time (Eastern Time): 01/21/2022 10:54:59 AM Caller scheduled for appt at 1:20. Referrals Warm transfer to backline

## 2022-01-21 NOTE — Patient Instructions (Signed)
It was very nice to see you today!   We gave you a steroid shot today to help your skin rash. Ok to take 2 Benadryl tabs when you get home. I have sent a steroid cream (Triamcinolone) to use on your rash to help the itching. I also have sent Hydroxyzine (similar to Benadryl but less drowsiness) to take during the day for your itching and for anxiety.      PLEASE NOTE:  If you had any lab tests please let us know if you have not heard back within a few days. You may see your results on MyChart before we have a chance to review them but we will give you a call once they are reviewed by [place]. If we ordered any referrals today, please let us know if you have not heard from their office within the next week.

## 2022-01-21 NOTE — Progress Notes (Signed)
Subjective:     Patient ID: Tracy Wise, female    DOB: Feb 21, 1971, 51 y.o.   MRN: [id number]  Chief Complaint  Patient presents with   Rash    Pt c/o rash widespread on legs and arms, Warm at the touch. Itchy, redness, pain. Nausea and vomiting due to acid reflux and was seen in ED.    Panic Attack    Pt has been having panic attacks due to what has been going on. Pt is not able to sleep and worrying.    HPI: Skin Rash: Patient complains of a rash. Symptoms began 3 days ago. Patient describes the rash as erythematous, excoriated, scattered. Characteristics of rash and associated history: Similar rash in the past? no, Is rash pruritic?  yes, Is rash painful?  burning sensation, Aggravating factors? believes it is drug induced. Patient's previous dermatologic history includes  nothing . Medications currently using: benadryl and benadryl cream . pt had to go to ER twice in the last few days for GI sx and one of the medications has caused a rash that has worsened with each passing day. Diffuse, red, with tiny bumps on her face, nose, bilateral arms, and lower anterior legs.  Gastritis:  Dx in ER, had to return to ER, given GI cocktail, but continued nausea and vomiting. Yesterday given Carafate and she has been taking this with Zofran and Prilosec with no new N,V sx. Bowels are moving normally.  Assessment & Plan:   Problem List Items Addressed This Visit       Respiratory   Acute rhinitis   Relevant Medications   levocetirizine (XYZAL) 5 MG tablet   Other Visit Diagnoses     Drug-induced skin rash    -  Primary believe r/t a med given to pt in ER, seen twice for gastritis, 2nd time MD told pt to stop Reglan, pt taking Zofran, Prilosec, and Carafate currently without worsening of rash.    Relevant Medications   methylPREDNISolone acetate (DEPO-MEDROL) injection 80 mg (Completed)   triamcinolone cream (KENALOG) 0.1 %   hydrOXYzine (ATARAX) 10 MG tablet   levocetirizine (XYZAL) 5  MG tablet   Nausea and vomiting, unspecified vomiting type    - seen twice in ER and finally doing better with Zofran, Prilosec, and Carafate.     Relevant Medications   ondansetron (ZOFRAN-ODT) 8 MG disintegrating tablet      Outpatient Medications Prior to Visit  Medication Sig Dispense Refill   albuterol (VENTOLIN HFA) 108 (90 Base) MCG/ACT inhaler Inhale 1-2 puffs into the lungs every 6 (six) hours as needed for wheezing or shortness of breath. 18 g 0   metoprolol tartrate (LOPRESSOR) 50 MG tablet Take 50 mg by mouth 2 (two) times daily.     omeprazole (PRILOSEC) 20 MG capsule Take 1 capsule (20 mg total) by mouth daily. 14 capsule 0   promethazine (PHENERGAN) 25 MG tablet TAKE 1 TABLET (25 MG TOTAL) BY MOUTH EVERY 6 (SIX) HOURS AS NEEDED. 30 tablet 5   rizatriptan (MAXALT) 10 MG tablet TAKE 1 TABLET BY MOUTH AS NEEDED FOR MIGRAINE. (Patient taking differently: Take 5 mg by mouth as needed for migraine.) 10 tablet 0   sucralfate (CARAFATE) 1 GM/10ML suspension Take 10 mLs (1 g total) by mouth 4 (four) times daily -  with meals and at bedtime. 420 mL 0   benzonatate (TESSALON) 100 MG capsule Take 1-2 capsules (100-200 mg total) by mouth 3 (three) times daily as needed for cough. [place]  capsule 0   levocetirizine (XYZAL) 5 MG tablet Take 1 tablet (5 mg total) by mouth every evening. 90 tablet 0   ondansetron (ZOFRAN-ODT) 4 MG disintegrating tablet Take 1 tablet (4 mg total) by mouth every 8 (eight) hours as needed for nausea or vomiting. 12 tablet 0   promethazine-dextromethorphan (PROMETHAZINE-DM) 6.25-15 MG/5ML syrup Take 5 mLs by mouth at bedtime as needed for cough. 100 mL 0   celecoxib (CELEBREX) 200 MG capsule TAKE 1 CAPSULE BY MOUTH TWICE A DAY (Patient not taking: Reported on 01/21/2022) 60 capsule 5   cyclobenzaprine (FLEXERIL) 10 MG tablet TAKE 1 TABLET (10 MG TOTAL) BY MOUTH AS NEEDED FOR MUSCLE SPASMS. (Patient not taking: Reported on 01/21/2022) 30 tablet 2   MOUNJARO 10 MG/0.5ML Pen  INJECT 10 MG INTO THE SKIN ONE TIME PER WEEK (Patient not taking: Reported on 01/21/2022) 2 mL 1   Plecanatide (TRULANCE) 3 MG TABS Take 1 tablet by mouth in the morning. (Patient not taking: Reported on 01/21/2022) 30 tablet 3   No facility-administered medications prior to visit.    Past Medical History:  Diagnosis Date   Allergy    Anemia    Arthritis    Ear infection    GERD (gastroesophageal reflux disease)    IBS (irritable bowel syndrome)    Migraine    SVT (supraventricular tachycardia) (HCC)    TMJ (dislocation of temporomandibular joint)     Past Surgical History:  Procedure Laterality Date   BACK SURGERY  03/24/2019   LAPAROSCOPIC TOTAL HYSTERECTOMY     TYMPANOSTOMY TUBE PLACEMENT Right    UPPER GASTROINTESTINAL ENDOSCOPY      Allergies  Allergen Reactions   Amoxicillin     "I get UTIs"       Objective:    Physical Exam Vitals and nursing note reviewed.  Constitutional:      Appearance: Normal appearance. She is obese.  Cardiovascular:     Rate and Rhythm: Normal rate and regular rhythm.  Pulmonary:     Effort: Pulmonary effort is normal.     Breath sounds: Normal breath sounds.  Musculoskeletal:        General: Normal range of motion.  Skin:    General: Skin is warm and dry.     Findings: Rash (face, bilateral lower arms, and anterior lower legs) present. Rash is urticarial (diffuse erythema).  Neurological:     Mental Status: She is alert.  Psychiatric:        Mood and Affect: Mood normal.        Behavior: Behavior normal.    BP 133/75 (BP Location: Left Arm, Patient Position: Sitting, Cuff Size: Large)   Pulse 68   Temp 97.9 F (36.6 C) (Temporal)   Ht 5\' 5"  (1.651 m)   Wt 240 lb 8 oz (109.1 kg)   SpO2 97%   BMI 40.02 kg/m  Wt Readings from Last 3 Encounters:  01/21/22 240 lb 8 oz (109.1 kg)  01/20/22 240 lb 1.3 oz (108.9 kg)  01/18/22 240 lb (108.9 kg)        Meds ordered this encounter  Medications   methylPREDNISolone acetate  (DEPO-MEDROL) injection 80 mg   ondansetron (ZOFRAN-ODT) 8 MG disintegrating tablet    Sig: Take 0.5 tablets (4 mg total) by mouth every 8 (eight) hours as needed for nausea or vomiting.    Dispense:  20 tablet    Refill:  0    ODT    Order Specific Question:   Supervising Provider  Answer:   ANDY, CAMILLE L [[id number]]   triamcinolone cream (KENALOG) 0.1 %    Sig: Apply 1 application. topically 2 (two) times daily. To skin rash.    Dispense:  30 g    Refill:  0    Order Specific Question:   Supervising Provider    Answer:   ANDY, CAMILLE L [[id number]]   hydrOXYzine (ATARAX) 10 MG tablet    Sig: Take 1-2 tablets (10-20 mg total) by mouth 3 (three) times daily as needed for itching or anxiety.    Dispense:  30 tablet    Refill:  0    Order Specific Question:   Supervising Provider    Answer:   ANDY, CAMILLE L [[id number]]   levocetirizine (XYZAL) 5 MG tablet    Sig: Take 1 tablet (5 mg total) by mouth every evening.    Dispense:  90 tablet    Refill:  0    Order Specific Question:   Supervising Provider    Answer:   ANDY, CAMILLE L [id number]    Jeanie Sewer, NP

## 2022-01-21 NOTE — Telephone Encounter (Signed)
Received backdoor nurse line stating pt would need to be seen within 4 hours. I was able to schedule pt on June 7,2023 at 1:20 pm with [facility].    FYI-- I will be including [facility]'s team in this conversation.

## 2022-02-05 ENCOUNTER — Telehealth: Payer: Self-pay | Admitting: Family Medicine

## 2022-02-05 NOTE — Telephone Encounter (Signed)
Patient states she seen [facility] on 6/7 for an ED follow up.  States she was prescribed sucralfate by the ED.  States she discussed taking this med with [facility].   States she is out of this med and is requesting [facility] [place]

## 2022-02-05 NOTE — Telephone Encounter (Signed)
ok to refill one time, no refills, thanks

## 2022-02-06 ENCOUNTER — Other Ambulatory Visit: Payer: Self-pay | Admitting: Family

## 2022-02-06 ENCOUNTER — Other Ambulatory Visit: Payer: Self-pay

## 2022-02-06 DIAGNOSIS — R112 Nausea with vomiting, unspecified: Secondary | ICD-10-CM

## 2022-02-06 MED ORDER — SUCRALFATE 1 GM/10ML PO SUSP: 1.0000 g | Freq: Three times a day (TID) | ORAL | 0 refills | Status: DC

## 2022-02-14 ENCOUNTER — Other Ambulatory Visit: Payer: Self-pay | Admitting: Physician Assistant

## 2022-03-03 ENCOUNTER — Telehealth: Payer: Self-pay | Admitting: Family Medicine

## 2022-03-03 NOTE — Telephone Encounter (Signed)
[facility] [place] is stating that her insurance is requiring a PA for [employer].  Is requesting this to be started.  Please give patient a call back with update.

## 2022-03-04 ENCOUNTER — Other Ambulatory Visit: Payer: Self-pay | Admitting: Family Medicine

## 2022-03-05 ENCOUNTER — Other Ambulatory Visit: Payer: Self-pay | Admitting: Family

## 2022-03-05 NOTE — Telephone Encounter (Signed)
Key: [id number] - PA Case ID: [id number] Status Sent to Plan today Drug Mounjaro 10MG /0.5ML pen-injectors Waiting for determination

## 2022-03-09 NOTE — Telephone Encounter (Signed)
Patient aware Rx denied  Will call insurance for medication coverage

## 2022-03-09 NOTE — Telephone Encounter (Signed)
Denied on July 21 Your PA Rx [facility] request has been denied. Unable to LVM, voice message is full

## 2022-03-16 NOTE — Telephone Encounter (Signed)
Pt states she would like PCP team to prescribe Wegovy at high/highest dosage comprable Mounjaro.  Has missed two injections, fridays (08/04 will be 3rd missed dose).  [phone] [place] [place] - [address] AT [address]  [address] [place], [place] [address]  Phone:  [phone]  Fax:  [phone]

## 2022-03-16 NOTE — Telephone Encounter (Signed)
Please advise 

## 2022-03-17 ENCOUNTER — Other Ambulatory Visit: Payer: Self-pay | Admitting: Family Medicine

## 2022-03-17 ENCOUNTER — Other Ambulatory Visit: Payer: Self-pay

## 2022-03-17 MED ORDER — WEGOVY 2.4 MG/0.75ML ~~LOC~~ SOAJ: 2.4000 mg | SUBCUTANEOUS | 2 refills | Status: DC

## 2022-03-17 NOTE — Addendum Note (Signed)
Addended by: Jimmye Norman on: 03/17/2022 02:26 PM   Modules accepted: Orders

## 2022-03-17 NOTE — Telephone Encounter (Signed)
Ok to send in [place] 2.4mg  once weekly.  Katina Degree. Jimmey Ralph, MD 03/17/2022 9:17 AM

## 2022-03-17 NOTE — Telephone Encounter (Signed)
Tried to contact pt voicemail is full unable to leave message. Rx for Wegovy 2.4 mg once a week was sent to pharmacy as requested.

## 2022-03-20 ENCOUNTER — Telehealth: Payer: Self-pay | Admitting: Family Medicine

## 2022-03-20 NOTE — Telephone Encounter (Signed)
Government social research officer at [facility] - Human resources officer [facility] Provider [name]- MD Contact Type Call Who Is Calling Pharmacy Patient Name Tracy Wise Patient DOB Jun 27, 1971 Call Type Pharmacy Message Only   Reason for Call Request to send message to Office Initial Comment [facility]ing from [facility] wanting to check the status of a patients prior auth. [facility] Pharmacy Number [phone]

## 2022-03-23 NOTE — Telephone Encounter (Signed)
Deniedon July 21 Your PA request has been denied

## 2022-03-24 NOTE — Telephone Encounter (Signed)
Left message to return call to our office at their convenience.  

## 2022-04-10 ENCOUNTER — Other Ambulatory Visit: Payer: Self-pay | Admitting: Family

## 2022-05-07 ENCOUNTER — Other Ambulatory Visit: Payer: Self-pay | Admitting: Family Medicine

## 2022-05-11 ENCOUNTER — Encounter: Payer: Self-pay | Admitting: *Deleted

## 2022-06-11 ENCOUNTER — Ambulatory Visit
Admission: EM | Admit: 2022-06-11 | Discharge: 2022-06-11 | Disposition: A | Discharge status: Home / Self Care | Payer: No Typology Code available for payment source | Attending: Family Medicine | Admitting: Family Medicine

## 2022-06-11 DIAGNOSIS — J069 Acute upper respiratory infection, unspecified: Secondary | ICD-10-CM | POA: Diagnosis present

## 2022-06-11 DIAGNOSIS — Z7952 Long term (current) use of systemic steroids: Secondary | ICD-10-CM | POA: Insufficient documentation

## 2022-06-11 DIAGNOSIS — Z79899 Other long term (current) drug therapy: Secondary | ICD-10-CM | POA: Diagnosis not present

## 2022-06-11 DIAGNOSIS — R059 Cough, unspecified: Secondary | ICD-10-CM | POA: Insufficient documentation

## 2022-06-11 DIAGNOSIS — Z1152 Encounter for screening for COVID-19: Secondary | ICD-10-CM | POA: Insufficient documentation

## 2022-06-11 DIAGNOSIS — R062 Wheezing: Secondary | ICD-10-CM | POA: Insufficient documentation

## 2022-06-11 LAB — RESP PANEL BY RT-PCR (FLU A&B, COVID) ARPGX2
Influenza A by PCR: NEGATIVE
Influenza B by PCR: NEGATIVE
SARS Coronavirus 2 by RT PCR: NEGATIVE

## 2022-06-11 MED ORDER — PROMETHAZINE-DM 6.25-15 MG/5ML PO SYRP: 5.0000 mL | ORAL_SOLUTION | Freq: Four times a day (QID) | ORAL | 0 refills | Status: DC | PRN

## 2022-06-11 MED ORDER — FLUTICASONE PROPIONATE 50 MCG/ACT NA SUSP: 1.0000 | Freq: Two times a day (BID) | NASAL | 2 refills | Status: DC

## 2022-06-11 MED ORDER — PREDNISONE 20 MG PO TABS: 40.0000 mg | ORAL_TABLET | Freq: Every day | ORAL | 0 refills | Status: DC

## 2022-06-11 MED ORDER — ALBUTEROL SULFATE HFA 108 (90 BASE) MCG/ACT IN AERS: 2.0000 | INHALATION_SPRAY | RESPIRATORY_TRACT | 0 refills | Status: AC | PRN

## 2022-06-11 NOTE — ED Triage Notes (Signed)
Pt reports nose stopped up, mucus, sore throat which started Monday. Pt took tylenol cold medicine etc but no relieve.

## 2022-06-11 NOTE — ED Provider Notes (Signed)
[facility]    CSN: [id number] Arrival date & time: 06/11/22  1210      History   Chief Complaint No chief complaint on file.   HPI Tracy Wise is a 51 y.o. female.    Presenting today with 4-day history of nasal congestion, sore throat, hacking cough with occasional wheezing.  Denies chest pain, shortness of breath, fever chills or body aches, nausea vomiting or diarrhea.  So far trying Tylenol Cold and flu with minimal relief.  Also has an albuterol inhaler at home as she has had some lung issues since being infected with COVID-19, has been using that as needed.  No known sick contacts recently.    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      Home Medications    Prior to Admission medications   Medication Sig Start Date End Date Taking? Authorizing Provider  fluticasone (FLONASE) 50 MCG/ACT nasal spray Place 1  spray into both nostrils 2 (two) times daily. 06/11/22  Yes Particia Nearing, PA-C  predniSONE (DELTASONE) 20 MG tablet Take 2 tablets (40 mg total) by mouth daily with breakfast. 06/11/22  Yes Particia Nearing, PA-C  promethazine-dextromethorphan (PROMETHAZINE-DM) 6.25-15 MG/5ML syrup Take 5 mLs by mouth 4 (four) times daily as needed. 06/11/22  Yes Particia Nearing, PA-C  albuterol (VENTOLIN HFA) 108 (90 Base) MCG/ACT inhaler Inhale 2 puffs into the lungs every 4 (four) hours as needed for wheezing or shortness of breath. 06/11/22   Particia Nearing, PA-C  celecoxib (CELEBREX) 200 MG capsule TAKE 1 CAPSULE BY MOUTH TWICE A DAY 05/07/22   Ardith Dark, MD  cyclobenzaprine (FLEXERIL) 10 MG tablet TAKE 1 TABLET (10 MG TOTAL) BY MOUTH AS NEEDED FOR MUSCLE SPASMS. 03/04/22   Ardith Dark, MD  hydrOXYzine (ATARAX) 10 MG tablet Take 1-2 tablets (10-20 mg total) by mouth 3 (three) times daily as needed for itching or anxiety. 01/21/22   Dulce Sellar, NP  levocetirizine (XYZAL) 5 MG tablet Take 1 tablet (5 mg total) by mouth every evening. 01/21/22   Dulce Sellar, NP  metoprolol tartrate (LOPRESSOR) 50 MG tablet Take 50 mg by mouth 2 (two) times daily.    [provider]  MOUNJARO 10 MG/0.5ML Pen INJECT 10 MG INTO THE SKIN ONE TIME PER WEEK Patient not taking: Reported on 01/21/2022 01/12/22  Vivi Barrack, MD  omeprazole (PRILOSEC) 20 MG capsule Take 1 capsule (20 mg total) by mouth daily. 01/18/22   Rayna Sexton, PA-C  ondansetron (ZOFRAN-ODT) 8 MG disintegrating tablet Take 0.5 tablets (4 mg total) by mouth every 8 (eight) hours as needed for nausea or vomiting. 01/21/22   Jeanie Sewer, NP  promethazine (PHENERGAN) 25 MG tablet TAKE 1 TABLET (25 MG TOTAL) BY MOUTH EVERY 6 (SIX) HOURS AS NEEDED. 01/01/22   Vivi Barrack, MD  rizatriptan (MAXALT) 10 MG tablet TAKE 1 TABLET BY MOUTH AS NEEDED FOR MIGRAINE. Patient taking differently: Take 5 mg by mouth as  needed for migraine. 10/09/21   Vivi Barrack, MD  Semaglutide-Weight Management (WEGOVY) 2.4 MG/0.75ML SOAJ Inject 2.4 mg into the skin once a week. 03/17/22   Vivi Barrack, MD  sucralfate (CARAFATE) 1 g tablet TAKE 1 TABLET BY MOUTH THREE TIMES A DAY 04/10/22   Jeanie Sewer, NP  sucralfate (CARAFATE) 1 GM/10ML suspension Take 10 mLs (1 g total) by mouth 4 (four) times daily -  with meals and at bedtime. 02/06/22   Jeanie Sewer, NP  triamcinolone cream (KENALOG) 0.1 % Apply 1 application. topically 2 (two) times daily. To skin rash. 01/21/22   Jeanie Sewer, NP  TRULANCE 3 MG TABS TAKE 1 TABLET BY MOUTH EVERY DAY IN THE MORNING 02/16/22   Esterwood, Amy S, PA-C    Family History Family History  Problem Relation Age of Onset   Colon polyps Mother    Healthy Mother    Asthma Mother    Heart disease Father    Cancer Father    Rectal cancer Neg Hx    Stomach cancer Neg Hx     Social History Social History   Tobacco Use   Smoking status: Never   Smokeless tobacco: Never  Vaping Use   Vaping Use: Never used  Substance Use Topics   Alcohol use: No   Drug use: No     Allergies   Amoxicillin   Review of Systems Review of Systems Per HPI  Physical Exam Triage Vital Signs ED Triage Vitals  Enc Vitals Group     BP 06/11/22 1250 (!) 146/79     Pulse Rate 06/11/22 1250 89     Resp 06/11/22 1250 18     Temp 06/11/22 1250 98.6 F (37 C)     Temp Source 06/11/22 1250 Oral     SpO2 06/11/22 1250 96 %     Weight --      Height --      Head Circumference --      Peak Flow --      Pain Score 06/11/22 1252 0     Pain Loc --      Pain Edu? --      Excl. in [place]? --    No data found.  Updated Vital Signs BP (!) 146/79 (BP Location: Right Arm)   Pulse 89   Temp 98.6 F (37 C) (Oral)   Resp 18   SpO2 96%   Visual Acuity Right Eye Distance:   Left Eye Distance:   Bilateral Distance:    Right Eye Near:   Left Eye Near:    Bilateral Near:     Physical  Exam Vitals and nursing note reviewed.  Constitutional:      Appearance: Normal appearance. She is not ill-appearing.  HENT:     Head: Atraumatic.     Right Ear: Tympanic membrane and external ear normal.  Left Ear: Tympanic membrane and external ear normal.     Nose: Congestion present.     Mouth/Throat:     Mouth: Mucous membranes are moist.     Pharynx: Posterior oropharyngeal erythema present.  Eyes:     Extraocular Movements: Extraocular movements intact.     Conjunctiva/sclera: Conjunctivae normal.  Cardiovascular:     Rate and Rhythm: Normal rate and regular rhythm.     Heart sounds: Normal heart sounds.  Pulmonary:     Effort: Pulmonary effort is normal.     Breath sounds: Normal breath sounds. No wheezing or rales.  Musculoskeletal:        General: Normal range of motion.     Cervical back: Normal range of motion and neck supple.  Skin:    General: Skin is warm and dry.  Neurological:     Mental Status: She is alert and oriented to person, place, and time.  Psychiatric:        Mood and Affect: Mood normal.        Thought Content: Thought content normal.        Judgment: Judgment normal.      UC Treatments / Results  Labs (all labs ordered are listed, but only abnormal results are displayed) Labs Reviewed  RESP PANEL BY RT-PCR (FLU A&B, COVID) [id number]    EKG   Radiology No results found.  Procedures Procedures (including critical care time)  Medications Ordered in UC Medications - No data to display  Initial Impression / Assessment and Plan / UC Course  I have reviewed the triage vital signs and the nursing notes.  Pertinent labs & imaging results that were available during my care of the patient were reviewed by me and considered in my medical decision making (see chart for details).     Vitals and exam overall reassuring and suggestive of a viral upper respiratory infection.  Respiratory panel pending, treat with prednisone, Phenergan DM,  albuterol inhaler, Flonase given her wheezing and her symptoms.  Supportive care and return precautions reviewed.  Work note given.   Final Clinical Impressions(s) / UC Diagnoses   Final diagnoses:  Viral URI with cough  Wheezing   Discharge Instructions   None    ED Prescriptions     Medication Sig Dispense Auth. Provider   albuterol (VENTOLIN HFA) 108 (90 Base) MCG/ACT inhaler Inhale 2 puffs into the lungs every 4 (four) hours as needed for wheezing or shortness of breath. 18 g Roosvelt Maser [place], [place]   promethazine-dextromethorphan (PROMETHAZINE-DM) 6.25-15 MG/5ML syrup Take 5 mLs by mouth 4 (four) times daily as needed. 100 mL Particia Nearing, PA-C   predniSONE (DELTASONE) 20 MG tablet Take 2 tablets (40 mg total) by mouth daily with breakfast. 10 tablet Particia Nearing, PA-C   fluticasone [facility]) 50 MCG/ACT nasal spray Place 1 spray into both nostrils 2 (two) times daily. 16 g Particia Nearing, [place]      PDMP not reviewed this encounter.   Particia Nearing, [place] 06/11/22 1511

## 2022-06-23 ENCOUNTER — Other Ambulatory Visit: Payer: Self-pay | Admitting: *Deleted

## 2022-06-23 MED ORDER — CYCLOBENZAPRINE HCL 10 MG PO TABS: 10.0000 mg | ORAL_TABLET | ORAL | 0 refills | Status: DC | PRN

## 2022-07-19 ENCOUNTER — Other Ambulatory Visit: Payer: Self-pay | Admitting: Family Medicine

## 2022-07-30 ENCOUNTER — Encounter: Payer: Self-pay | Admitting: *Deleted

## 2022-08-11 ENCOUNTER — Telehealth: Payer: Self-pay | Admitting: Family Medicine
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
## 2022-08-12 ENCOUNTER — Ambulatory Visit: Payer: No Typology Code available for payment source | Admitting: Physician Assistant

## 2022-08-20 ENCOUNTER — Other Ambulatory Visit: Payer: Self-pay | Admitting: Family Medicine

## 2022-08-21 ENCOUNTER — Other Ambulatory Visit: Payer: Self-pay | Admitting: Family Medicine

## 2022-08-24 ENCOUNTER — Other Ambulatory Visit: Payer: Self-pay | Admitting: Family Medicine

## 2022-09-15 ENCOUNTER — Ambulatory Visit: Payer: No Typology Code available for payment source | Admitting: Family Medicine

## 2022-09-22 ENCOUNTER — Encounter: Payer: Self-pay | Admitting: Family Medicine

## 2022-09-22 ENCOUNTER — Ambulatory Visit: Payer: No Typology Code available for payment source | Admitting: Family Medicine

## 2022-09-22 VITALS — BP 110/74 | HR 72 | Temp 98.2°F | Ht 65.0 in | Wt 226.5 lb

## 2022-09-22 DIAGNOSIS — L270 Generalized skin eruption due to drugs and medicaments taken internally: Secondary | ICD-10-CM | POA: Diagnosis not present

## 2022-09-22 DIAGNOSIS — Z13220 Encounter for screening for lipoid disorders: Secondary | ICD-10-CM | POA: Diagnosis not present

## 2022-09-22 DIAGNOSIS — J00 Acute nasopharyngitis [common cold]: Secondary | ICD-10-CM

## 2022-09-22 DIAGNOSIS — E1169 Type 2 diabetes mellitus with other specified complication: Secondary | ICD-10-CM

## 2022-09-22 DIAGNOSIS — G43809 Other migraine, not intractable, without status migrainosus: Secondary | ICD-10-CM

## 2022-09-22 DIAGNOSIS — G8929 Other chronic pain: Secondary | ICD-10-CM

## 2022-09-22 DIAGNOSIS — J309 Allergic rhinitis, unspecified: Secondary | ICD-10-CM

## 2022-09-22 DIAGNOSIS — M545 Low back pain, unspecified: Secondary | ICD-10-CM

## 2022-09-22 LAB — LIPID PANEL
Cholesterol: 189 mg/dL (ref 0–200)
HDL: 44.5 mg/dL (ref >39.00)
LDL Cholesterol: 131 mg/dL — ABNORMAL HIGH (ref 0–99)
NonHDL: 144.66
Total CHOL/HDL Ratio: 4
Triglycerides: 68 mg/dL (ref 0.0–149.0)
VLDL: 13.6 mg/dL (ref 0.0–40.0)

## 2022-09-22 LAB — COMPREHENSIVE METABOLIC PANEL
ALT: 9 U/L (ref 0–35)
AST: 11 U/L (ref 0–37)
Albumin: 4.3 g/dL (ref 3.5–5.2)
Alkaline Phosphatase: 71 U/L (ref 39–117)
BUN: 11 mg/dL (ref 6–23)
CO2: 28 mEq/L (ref 19–32)
Calcium: 9.3 mg/dL (ref 8.4–10.5)
Chloride: 103 mEq/L (ref 96–112)
Creatinine, Ser: 0.77 mg/dL (ref 0.40–1.20)
GFR: 88.04 mL/min (ref >60.00)
Glucose, Bld: 95 mg/dL (ref 70–99)
Potassium: 4.4 mEq/L (ref 3.5–5.1)
Sodium: 141 mEq/L (ref 135–145)
Total Bilirubin: 0.7 mg/dL (ref 0.2–1.2)
Total Protein: 7.3 g/dL (ref 6.0–8.3)

## 2022-09-22 LAB — CBC
HCT: 42 % (ref 36.0–46.0)
Hemoglobin: 13.7 g/dL (ref 12.0–15.0)
MCHC: 32.6 g/dL (ref 30.0–36.0)
MCV: 83.3 fl (ref 78.0–100.0)
Platelets: 204 10*3/uL (ref 150.0–400.0)
RBC: 5.05 Mil/uL (ref 3.87–5.11)
RDW: 15.6 % — ABNORMAL HIGH (ref 11.5–15.5)
WBC: 5.4 10*3/uL (ref 4.0–10.5)

## 2022-09-22 LAB — TSH: TSH: 1.53 u[IU]/mL (ref 0.35–5.50)

## 2022-09-22 LAB — HEMOGLOBIN A1C: Hgb A1c MFr Bld: 5.9 % (ref 4.6–6.5)

## 2022-09-22 MED ORDER — MOUNJARO 10 MG/0.5ML ~~LOC~~ SOAJ: 10.0000 mg | SUBCUTANEOUS | 1 refills | Status: DC

## 2022-09-22 MED ORDER — CELECOXIB 200 MG PO CAPS: 200.0000 mg | ORAL_CAPSULE | Freq: Two times a day (BID) | ORAL | 1 refills | Status: DC

## 2022-09-22 MED ORDER — LEVOCETIRIZINE DIHYDROCHLORIDE 5 MG PO TABS: 5.0000 mg | ORAL_TABLET | Freq: Every evening | ORAL | 0 refills | Status: DC

## 2022-09-22 MED ORDER — AZELASTINE HCL 0.1 % NA SOLN: 2.0000 | Freq: Two times a day (BID) | NASAL | 12 refills | Status: DC

## 2022-09-22 MED ORDER — RIZATRIPTAN BENZOATE 10 MG PO TABS: 10.0000 mg | ORAL_TABLET | ORAL | 0 refills | Status: DC | PRN

## 2022-09-22 MED ORDER — CYCLOBENZAPRINE HCL 10 MG PO TABS: 10.0000 mg | ORAL_TABLET | ORAL | 0 refills | Status: DC | PRN

## 2022-09-22 NOTE — Addendum Note (Signed)
Addended by: Loura Back on: 09/22/2022 01:30 PM   Modules accepted: Orders

## 2022-09-22 NOTE — Assessment & Plan Note (Signed)
Overall stable.  Did have 1 flare recently related to eating hot dog.  Will refill her Maxalt today.

## 2022-09-22 NOTE — Assessment & Plan Note (Signed)
Not controlled.  Still has some evidence of eustachian tube dysfunction as well.  Advised her to follow-up with ENT send regarding her tympanostomy tube that we will start Astelin nasal spray.  Will also refill her Xyzal today.

## 2022-09-22 NOTE — Assessment & Plan Note (Signed)
Overall symptoms are stable.  No red flags.  Uses Celebrex 2 mg twice daily and Flexeril 10 mg 3 times daily as needed.  Will refill today.

## 2022-09-22 NOTE — Assessment & Plan Note (Signed)
Last A1c 6.3 on semaglutide.  She has also had several fasting sugars above 120.  She feels like she is having some modest success with semaglutide that is interested in trying alternatives.  Will switch to Mounjaro 10 mg daily.  She is currently on semaglutide 1 mg weekly.  She is aware of potential side effects.  She can follow-up in a few weeks via MyChart.  We will check A1c today and we can recheck again in 3 to 6 months depending on result.

## 2022-09-22 NOTE — Patient Instructions (Addendum)
It was very nice to see you today!  We will check blood work today.  We will switch your semaglutide to [facility] for the diabetes.  We will check your A1c today.  Please send me a message in a few weeks to let me know how this is working.  I will refill your medications today.  Please try Astelin nasal spray to follow-up with the ear nose and throat doctor regarding your ears.  We will see you back in 3 to 6 months depending on results of your blood work.  Come back sooner if needed.  Take care, Dr Jerline Pain  PLEASE NOTE:  If you had any lab tests, please let us know if you have not heard back within a few days. You may see your results on mychart before we have a chance to review them but we will give you a call once they are reviewed by [place].   If we ordered any referrals today, please let us know if you have not heard from their office within the next week.   If you had any urgent prescriptions sent in today, please check with the pharmacy within an hour of our visit to make sure the prescription was transmitted appropriately.   Please try these tips to maintain a healthy lifestyle:  Eat at least 3 REAL meals and 1-2 snacks per day.  Aim for no more than 5 hours between eating.  If you eat breakfast, please do so within one hour of getting up.   Each meal should contain half fruits/vegetables, one quarter protein, and one quarter carbs (no bigger than a computer mouse)  Cut down on sweet beverages. This includes juice, soda, and sweet tea.   Drink at least 1 glass of water with each meal and aim for at least 8 glasses per day  Exercise at least 150 minutes every week.

## 2022-09-22 NOTE — Progress Notes (Signed)
   Tracy Wise is a 52 y.o. female who presents today for an office visit.  Assessment/Plan:  Chronic Problems Addressed Today: T2DM (type 2 diabetes mellitus) (HCC) Last A1c 6.3 on semaglutide.  She has also had several fasting sugars above 120.  She feels like she is having some modest success with semaglutide that is interested in trying alternatives.  Will switch to Mounjaro 10 mg daily.  She is currently on semaglutide 1 mg weekly.  She is aware of potential side effects.  She can follow-up in a few weeks via MyChart.  We will check A1c today and we can recheck again in 3 to 6 months depending on result.  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  No red flags.  Uses Celebrex 2 mg twice daily and Flexeril 10 mg 3 times daily as needed.  Will refill today.  Migraine Overall stable.  Did have 1 flare recently related to eating hot dog.  Will refill her Maxalt today.  Morbid obesity ([place]) BMI 37 daily, abilities.  She is down about 9 pounds since her last visit with me on semaglutide.  Will be switching from Ozempic to [facility] as above.  Allergic rhinitis Not controlled.  Still has some evidence of eustachian tube dysfunction as well.  Advised her to follow-up with ENT send regarding her tympanostomy tube that we will start Astelin nasal spray.  Will also refill her Xyzal today.  Preventative health care She is agreeable to check labs.  She is up-to-date on colon cancer screening.  Advised her to come back soon for CPE.    Subjective:  HPI:  See A/p for status of chronic conditions. She was last seen here over a year ago for her annual physical.  She needs refills on medications today.  Since her last visit she had an episode of gastritis about 6 months ago and had to go to the ED.  Otherwise she has been doing well.  She is following with weight management clinic since her last visit and have been managing her insulin  resistance, diabetes, and obesity with semaglutide.  She has lost some weight on current dose of semaglutide 1 mg weekly.  No significant side effects.  She is also noticed more pressure sensation in her ears.  She has previously seen ENT in the past for this and has had to place in the right ear.       Objective:  Physical Exam: BP 110/74 (BP Location: Left Arm, Patient Position: Sitting, Cuff Size: Large)   Pulse 72   Temp 98.2 F (36.8 C) (Temporal)   Ht 5\' 5"  (1.651 m)   Wt 226 lb 8 oz (102.7 kg)   BMI 37.69 kg/m   Wt Readings from Last 3 Encounters:  09/22/22 226 lb 8 oz (102.7 kg)  01/21/22 240 lb 8 oz (109.1 kg)  01/20/22 240 lb 1.3 oz (108.9 kg)    Gen: No acute distress, resting comfortably HEENT: Left TM clear effusion.  Right TM with clear effusion.  Tympanostomy tube in place and right EAC. CV: Regular rate and rhythm with no murmurs appreciated Pulm: Normal work of breathing, clear to auscultation bilaterally with no crackles, wheezes, or rhonchi Neuro: Grossly normal, moves all extremities Psych: Normal affect and thought content      Kayton Ripp M. Jerline Pain, MD 09/22/2022 12:26 PM

## 2022-09-22 NOTE — Assessment & Plan Note (Signed)
BMI 37 daily, abilities.  She is down about 9 pounds since her last visit with me on semaglutide.  Will be switching from Ozempic to [facility] as above.

## 2022-09-24 NOTE — Progress Notes (Signed)
Please inform patient of the following:  Her "bad" cholesterol is up a little bit since last year.  Her blood sugar is better than last year.  She should keep up the good work with diet and exercise and working on weight loss.   We will see her back in 6 months to recheck her A1c.  Everything else is stable and we can recheck in a year.

## 2022-09-25 ENCOUNTER — Telehealth: Payer: Self-pay | Admitting: Family Medicine

## 2022-09-25 NOTE — Telephone Encounter (Signed)
Coupon placed at front office, patient aware

## 2022-09-25 NOTE — Telephone Encounter (Signed)
Patient states PA is required for tirzepatide [facility]) 10 MG/0.5ML Pen

## 2022-09-25 NOTE — Telephone Encounter (Signed)
Patient states Pharmacist told Patient to ask for a Savings Coupon for tirzepatide [facility]) 10 MG/0.5ML Pen .  Patient requests to be called regarding the above.

## 2022-09-29 ENCOUNTER — Other Ambulatory Visit (HOSPITAL_COMMUNITY): Payer: Self-pay

## 2022-09-29 NOTE — Telephone Encounter (Signed)
Patient Advocate Encounter  Prior Authorization for Mounjaro 10MG/0.5ML pen-injectors has been approved.    PA# [id number] Key: Nestor Lewandowsky

## 2022-09-29 NOTE — Telephone Encounter (Signed)
Left voice massage Rx Tracy Wise was approved  Contact your pharmacy for refills

## 2022-10-21 ENCOUNTER — Other Ambulatory Visit: Payer: Self-pay | Admitting: Family Medicine

## 2022-10-22 ENCOUNTER — Other Ambulatory Visit: Payer: Self-pay | Admitting: Family Medicine

## 2022-10-27 ENCOUNTER — Other Ambulatory Visit: Payer: Self-pay

## 2022-10-27 ENCOUNTER — Telehealth: Payer: Self-pay | Admitting: Family Medicine

## 2022-10-27 MED ORDER — TIRZEPATIDE 7.5 MG/0.5ML ~~LOC~~ SOAJ: 7.5000 mg | SUBCUTANEOUS | 0 refills | Status: DC

## 2022-10-27 NOTE — Telephone Encounter (Signed)
[facility] [place]

## 2022-10-27 NOTE — Telephone Encounter (Signed)
Patient states Pharmacy is out of stock of tirzepatide [facility]) 10 MG/0.5ML Pen.  Requests RX for Mounjaro 7.5 MG (in stock) [id number] [place]: [phone] Fax: [phone]

## 2022-11-26 ENCOUNTER — Other Ambulatory Visit: Payer: Self-pay | Admitting: Family Medicine

## 2022-11-27 ENCOUNTER — Other Ambulatory Visit: Payer: Self-pay | Admitting: Family Medicine

## 2022-12-07 ENCOUNTER — Telehealth: Payer: Self-pay

## 2022-12-07 NOTE — Telephone Encounter (Signed)
PA renewal request received via CMM for Trulance  tablets  PA has been submitted to [facility] and has been APPROVED through 12/07/2023  Key: [id number]

## 2022-12-10 ENCOUNTER — Telehealth: Payer: Self-pay | Admitting: Family Medicine

## 2022-12-10 NOTE — Telephone Encounter (Signed)
PT states they usually get 3 boxes but the RX says 1 for tirzepatide [facility]) 10 MG/0.5ML Pen  Please advise.

## 2022-12-14 ENCOUNTER — Other Ambulatory Visit: Payer: Self-pay

## 2022-12-14 MED ORDER — TIRZEPATIDE 7.5 MG/0.5ML ~~LOC~~ SOAJ: 7.5000 mg | SUBCUTANEOUS | 0 refills | Status: DC

## 2022-12-14 NOTE — Telephone Encounter (Signed)
[facility] had to [facility] [place], states once they received the script it would only fill for 1 box. Advised to patient they might have only had 1 box in stock. I have sent an additional script to this [facility] to fill for the additional 2 boxes per patient request.

## 2022-12-18 ENCOUNTER — Other Ambulatory Visit: Payer: Self-pay | Admitting: Family Medicine

## 2022-12-24 ENCOUNTER — Ambulatory Visit: Payer: No Typology Code available for payment source | Admitting: Family Medicine

## 2022-12-24 VITALS — BP 100/68 | HR 81 | Temp 96.4°F | Ht 65.0 in | Wt 222.6 lb

## 2022-12-24 DIAGNOSIS — Z7985 Long-term (current) use of injectable non-insulin antidiabetic drugs: Secondary | ICD-10-CM | POA: Diagnosis not present

## 2022-12-24 DIAGNOSIS — Z23 Encounter for immunization: Secondary | ICD-10-CM

## 2022-12-24 DIAGNOSIS — H6993 Unspecified Eustachian tube disorder, bilateral: Secondary | ICD-10-CM | POA: Diagnosis not present

## 2022-12-24 DIAGNOSIS — E1169 Type 2 diabetes mellitus with other specified complication: Secondary | ICD-10-CM | POA: Diagnosis not present

## 2022-12-24 LAB — POCT GLYCOSYLATED HEMOGLOBIN (HGB A1C): Hemoglobin A1C: 5.4 % (ref 4.0–5.6)

## 2022-12-24 MED ORDER — PROMETHAZINE HCL 25 MG PO TABS: 25.0000 mg | ORAL_TABLET | Freq: Four times a day (QID) | ORAL | 5 refills | Status: DC | PRN

## 2022-12-24 MED ORDER — TIRZEPATIDE 12.5 MG/0.5ML ~~LOC~~ SOAJ: 12.5000 mg | SUBCUTANEOUS | 0 refills | Status: DC

## 2022-12-24 NOTE — Progress Notes (Signed)
   Tracy Wise is a 52 y.o. female who presents today for an office visit.  Assessment/Plan:  Chronic Problems Addressed Today: T2DM (type 2 diabetes mellitus) (HCC) A1c 5.4 today.  She is doing well with current regimen.  She is interested in increasing dose of Mounjaro to help more with weight loss.  We will increase to 12.5 mg weekly.  She is aware of potential side effects.  She can follow up with [place] in a few weeks via MyChart we can titrate the dose as tolerated.  She will come back in 6 months for CPE.  We can recheck A1c at that time.  Morbid obesity (HCC) BMI 37 with comorbidities.  She is down about 4 pounds since her last visit.  Will be increasing dose of Mounjaro as above to 12.5 mg weekly.  She can follow-up in a few weeks and we can titrate as needed.  Dysfunction of both eustachian tubes Recently had tympanostomy tube placed.  Doing well with this.  Tdap given today.    Subjective:  HPI:  See Assessment / plan for status of chronic conditions.  She is here today for diabetes follow-up.  Last saw her 3 months ago.  At that time A1c was 5.9 on semaglutide.  We switched her to Mounjaro 10 mg weekly.  There were some supply shortages with this since we had to go to 7.5 mg weekly.  She has done well with this and is now back on the 10 mg weekly.  She is interested increasing the dose.  She has been tolerating well without any significant side effects.       Objective:  Physical Exam: BP 100/68   Pulse 81   Temp (!) 96.4 F (35.8 C) (Temporal)   Ht 5\' 5"  (1.651 m)   Wt 222 lb 9.6 oz (101 kg)   SpO2 99%   BMI 37.04 kg/m   Wt Readings from Last 3 Encounters:  12/24/22 222 lb 9.6 oz (101 kg)  09/22/22 226 lb 8 oz (102.7 kg)  01/21/22 240 lb 8 oz (109.1 kg)  Gen: No acute distress, resting comfortably Neuro: Grossly normal, moves all extremities Psych: Normal affect and thought content      Tracy Wise M. Jimmey Ralph, MD 12/24/2022 11:53 AM

## 2022-12-24 NOTE — Assessment & Plan Note (Signed)
A1c 5.4 today.  She is doing well with current regimen.  She is interested in increasing dose of Mounjaro to help more with weight loss.  We will increase to 12.5 mg weekly.  She is aware of potential side effects.  She can follow up with [place] in a few weeks via MyChart we can titrate the dose as tolerated.  She will come back in 6 months for CPE.  We can recheck A1c at that time.

## 2022-12-24 NOTE — Patient Instructions (Addendum)
It was very nice to see you today!  Your A1c today looks great.  Will increase your Mounjaro to 12.5 mg weekly.  Send me a message in a few weeks to let me know how you are doing with this.  I will refill your Phenergan today as well  Return in about 6 months (around 06/26/2023).   Take care, Dr Jimmey Ralph  PLEASE NOTE:  If you had any lab tests, please let us know if you have not heard back within a few days. You may see your results on mychart before we have a chance to review them but we will give you a call once they are reviewed by [place].   If we ordered any referrals today, please let us know if you have not heard from their office within the next week.   If you had any urgent prescriptions sent in today, please check with the pharmacy within an hour of our visit to make sure the prescription was transmitted appropriately.   Please try these tips to maintain a healthy lifestyle:  Eat at least 3 REAL meals and 1-2 snacks per day.  Aim for no more than 5 hours between eating.  If you eat breakfast, please do so within one hour of getting up.   Each meal should contain half fruits/vegetables, one quarter protein, and one quarter carbs (no bigger than a computer mouse)  Cut down on sweet beverages. This includes juice, soda, and sweet tea.   Drink at least 1 glass of water with each meal and aim for at least 8 glasses per day  Exercise at least 150 minutes every week.

## 2022-12-24 NOTE — Addendum Note (Signed)
Addended by: Dyann Kief on: 12/24/2022 12:04 PM   Modules accepted: Orders

## 2022-12-24 NOTE — Assessment & Plan Note (Signed)
Recently had tympanostomy tube placed.  Doing well with this.

## 2022-12-24 NOTE — Assessment & Plan Note (Signed)
BMI 37 with comorbidities.  She is down about 4 pounds since her last visit.  Will be increasing dose of Mounjaro as above to 12.5 mg weekly.  She can follow-up in a few weeks and we can titrate as needed.

## 2022-12-25 ENCOUNTER — Other Ambulatory Visit: Payer: Self-pay | Admitting: Family Medicine

## 2022-12-26 ENCOUNTER — Other Ambulatory Visit: Payer: Self-pay | Admitting: Family Medicine

## 2023-01-02 LAB — HM DIABETES EYE EXAM

## 2023-01-26 ENCOUNTER — Other Ambulatory Visit: Payer: Self-pay | Admitting: Family Medicine

## 2023-02-24 ENCOUNTER — Other Ambulatory Visit: Payer: Self-pay

## 2023-02-24 ENCOUNTER — Encounter: Payer: Self-pay | Admitting: Emergency Medicine

## 2023-02-24 ENCOUNTER — Ambulatory Visit
Admission: EM | Admit: 2023-02-24 | Discharge: 2023-02-24 | Disposition: A | Discharge status: Home / Self Care | Payer: No Typology Code available for payment source | Attending: Nurse Practitioner | Admitting: Nurse Practitioner

## 2023-02-24 DIAGNOSIS — N200 Calculus of kidney: Secondary | ICD-10-CM

## 2023-02-24 LAB — POCT URINALYSIS DIP (MANUAL ENTRY)
Bilirubin, UA: NEGATIVE
Glucose, UA: NEGATIVE mg/dL
Ketones, POC UA: NEGATIVE mg/dL
Leukocytes, UA: NEGATIVE
Nitrite, UA: NEGATIVE
Protein Ur, POC: NEGATIVE mg/dL
Spec Grav, UA: 1.015 (ref 1.010–1.025)
Urobilinogen, UA: 0.2 E.U./dL
pH, UA: 6 (ref 5.0–8.0)

## 2023-02-24 MED ORDER — IBUPROFEN 800 MG PO TABS: 800.0000 mg | ORAL_TABLET | Freq: Three times a day (TID) | ORAL | 0 refills | Status: DC | PRN

## 2023-02-24 MED ORDER — TAMSULOSIN HCL 0.4 MG PO CAPS: 0.4000 mg | ORAL_CAPSULE | Freq: Every day | ORAL | 0 refills | Status: DC

## 2023-02-24 MED ORDER — KETOROLAC TROMETHAMINE 30 MG/ML IJ SOLN
30.0000 mg | Freq: Once | INTRAMUSCULAR | Status: AC
  Administered 2023-02-24: 30 mg via INTRAMUSCULAR

## 2023-02-24 NOTE — Discharge Instructions (Signed)
You had been given Toradol 30 mg today.  Do not take any additional ibuprofen today.  Recommend taking Tylenol Arthritis Strength 650 mg tablets as needed for breakthrough pain. Your urinalysis does not indicate that you have a urinary tract infection.  I do suspect that you have a kidney stone.  A urine culture has been ordered.  You will be contacted if the pending test result is abnormal. Take medication as prescribed. Make sure you are drinking at least 10-12 8 ounce glasses of water daily while symptoms persist. May take Tylenol arthritis strength 650 mg tablets with the ibuprofen for severe pain or 1 to 2 hours after ibuprofen as needed. If you experience worsening pain, not controlled by the ibuprofen or Tylenol, please follow-up in the emergency department for further evaluation. Follow-up as needed.

## 2023-02-24 NOTE — ED Triage Notes (Signed)
Pt reports right flank pain that radiates to right lower back to RLQ abd for last several days. Pt reports dysuria, denies injury.

## 2023-02-24 NOTE — ED Provider Notes (Signed)
[facility]    CSN: [id number] Arrival date & time: 02/24/23  1256      History   Chief Complaint Chief Complaint  Patient presents with   Back Pain    HPI Tracy Wise is a 52 y.o. female.   The history is provided by the patient.   The patient presents for complaints of right-sided low back/flank pain that started over the past 24 hours.  Patient states that the symptoms started, the pain appears to be "wrapping around" the right lower portion of her abdomen.  She also complains of urinary frequency.  She denies fever, chills, injury, trauma, urinary hesitancy, dysuria, decreased urine flow, abdominal pain, vomiting, or diarrhea.  Patient states that she does have a history of chronic back pain, and has had surgery, but that this pain feels "different".  Patient has been taking ibuprofen, Tylenol, using Biofreeze, and placing lidocaine patches to her right back with no relief.  Patient denies prior history of kidney stones or pyelonephritis.  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      Home Medications    Prior to Admission medications   Medication Sig Start Date End Date Taking? Authorizing Provider  ibuprofen (ADVIL) 800 MG tablet Take 1 tablet (800 mg total) by mouth every 8 (eight) hours as needed. 02/24/23  Yes Birl Lobello-Warren, Sadie Haber, NP  tamsulosin (FLOMAX) 0.4 MG CAPS capsule Take 1 capsule (0.4 mg total) by mouth daily after supper. 02/24/23  Yes Maridee Slape-Warren, Sadie Haber, NP  albuterol (VENTOLIN HFA) 108 (90 Base) MCG/ACT inhaler Inhale 2 puffs into the lungs every 4 (four) hours as needed for wheezing or shortness of breath. 06/11/22   Particia Nearing, PA-C  celecoxib (CELEBREX) 200 MG capsule TAKE 1 CAPSULE BY MOUTH TWICE A DAY 01/26/23   Ardith Dark, MD  cyclobenzaprine (FLEXERIL) 10 MG tablet TAKE 1 TABLET (10 MG TOTAL) BY MOUTH AS NEEDED FOR MUSCLE SPASMS. Patient not taking: Reported on 02/24/2023 01/26/23   Ardith Dark, MD  metoprolol tartrate (LOPRESSOR) 50 MG tablet Take 50 mg by mouth 2 (two) times daily.    [provider]  promethazine (PHENERGAN) 25 MG tablet Take 1 tablet (25 mg total) by mouth every 6 (six) hours as needed. 12/24/22   Ardith Dark, MD  rizatriptan (  MAXALT) 10 MG tablet TAKE 1 TABLET BY MOUTH AS NEEDED FOR MIGRAINE. 12/28/22   Ardith Dark, MD  tirzepatide [facility]) 12.5 MG/0.5ML Pen Inject 12.5 mg into the skin once a week. 12/24/22   Ardith Dark, MD  TRULANCE 3 MG TABS TAKE 1 TABLET BY MOUTH EVERY DAY IN THE MORNING 02/16/22   Esterwood, Amy Kathie Rhodes, PA-C    Family History Family History  Problem Relation Age of Onset   Colon polyps Mother    Healthy Mother    Asthma Mother    Heart disease Father    Cancer Father    Rectal cancer Neg Hx    Stomach cancer Neg Hx     Social History Social History   Tobacco Use    Smoking status: Never   Smokeless tobacco: Never  Vaping Use   Vaping Use: Never used  Substance Use Topics   Alcohol use: No   Drug use: No     Allergies   Amoxicillin   Review of Systems Review of Systems Per HPI  Physical Exam Triage Vital Signs ED Triage Vitals  Enc Vitals Group     BP 02/24/23 1327 124/82     Pulse Rate 02/24/23 1327 (!) 55     Resp 02/24/23 1327 20     Temp 02/24/23 1327 98.1 F (36.7 C)     Temp Source 02/24/23 1327 Oral     SpO2 02/24/23 1327 96 %     Weight --      Height --      Head Circumference --      Peak Flow --      Pain Score 02/24/23 1326 10     Pain Loc --      Pain Edu? --      Excl. in GC? --    No data found.  Updated Vital Signs BP 124/82 (BP Location: Right Arm)   Pulse (!) 55   Temp 98.1 F (36.7 C) (Oral)   Resp 20   SpO2 96%   Visual Acuity Right Eye Distance:   Left Eye Distance:   Bilateral Distance:    Right Eye Near:   Left Eye Near:    Bilateral Near:     Physical Exam Vitals and nursing note reviewed.  Constitutional:      General: She is not in acute distress.    Appearance: Normal appearance.  HENT:     Head: Normocephalic.  Eyes:     Extraocular Movements: Extraocular movements intact.     Conjunctiva/sclera: Conjunctivae normal.     Pupils: Pupils are equal, round, and reactive to light.  Cardiovascular:     Rate and Rhythm: Regular rhythm.     Pulses: Normal pulses.     Heart sounds: Normal heart sounds.  Pulmonary:     Effort: Pulmonary effort is normal. No respiratory distress.     Breath sounds: Normal breath sounds. No stridor. No wheezing, rhonchi or rales.  Abdominal:     General: Bowel sounds are normal.     Palpations: Abdomen is soft.     Tenderness: There is no abdominal tenderness. There is right CVA tenderness.  Musculoskeletal:     Cervical back: Normal range of motion.  Lymphadenopathy:     Cervical: No cervical adenopathy.  Skin:    General: Skin is warm and  dry.  Neurological:     General: No focal deficit present.     Mental Status: She is alert and oriented to person,  place, and time.  Psychiatric:        Mood and Affect: Mood normal.        Behavior: Behavior normal.      UC Treatments / Results  Labs (all labs ordered are listed, but only abnormal results are displayed) Labs Reviewed  POCT URINALYSIS DIP (MANUAL ENTRY) - Abnormal; Notable for the following components:      Result Value   Blood, UA large (*)    All other components within normal limits    EKG   Radiology No results found.  Procedures Procedures (including critical care time)  Medications Ordered in UC Medications  ketorolac (TORADOL) 30 MG/ML injection 30 mg (30 mg Intramuscular Given 02/24/23 1421)    Initial Impression / Assessment and Plan / UC Course  I have reviewed the triage vital signs and the nursing notes.  Pertinent labs & imaging results that were available during my care of the patient were reviewed by me and considered in my medical decision making (see chart for details).  The patient is well-appearing, she is in no acute distress, vital signs are stable.  Urinalysis is negative for UTI; however, patient does have blood.  She also complains of pain that started in the right lower back and is now moved to the right flank and then to her right lower abdomen.  On exam, she does have right CVA tenderness.  Symptoms appear to be consistent with a kidney stone.  Will treat with Toradol 30 mg IM.  Tamsulosin 0.4 mg daily and ibuprofen 800 mg..  Supportive care recommendations were provided and discussed with the patient to include increasing her fluid intake, use of over-the-counter Tylenol for pain or discomfort, and a urine strainer to strain her urine.  Patient was given strict ER follow-up precautions.  Patient is in agreement with this plan of care and verbalizes understanding.  All questions were answered.  Patient stable for discharge.  Final  Clinical Impressions(s) / UC Diagnoses   Final diagnoses:  Kidney stone on right side     Discharge Instructions      You had been given Toradol 30 mg today.  Do not take any additional ibuprofen today.  Recommend taking Tylenol Arthritis Strength 650 mg tablets as needed for breakthrough pain. Your urinalysis does not indicate that you have a urinary tract infection.  I do suspect that you have a kidney stone.  A urine culture has been ordered.  You will be contacted if the pending test result is abnormal. Take medication as prescribed. Make sure you are drinking at least 10-12 8 ounce glasses of water daily while symptoms persist. May take Tylenol arthritis strength 650 mg tablets with the ibuprofen for severe pain or 1 to 2 hours after ibuprofen as needed. If you experience worsening pain, not controlled by the ibuprofen or Tylenol, please follow-up in the emergency department for further evaluation. Follow-up as needed.      ED Prescriptions     Medication Sig Dispense Auth. Provider   tamsulosin (FLOMAX) 0.4 MG CAPS capsule Take 1 capsule (0.4 mg total) by mouth daily after supper. 30 capsule Zacherie Honeyman-Warren, Sadie Haber, NP   ibuprofen (ADVIL) 800 MG tablet Take 1 tablet (800 mg total) by mouth every 8 (eight) hours as needed. 30 tablet Kataryna Mcquilkin-Warren, Sadie Haber, NP      PDMP not reviewed this encounter.   Abran Cantor, NP 02/24/23 1450

## 2023-03-04 ENCOUNTER — Other Ambulatory Visit (HOSPITAL_COMMUNITY): Payer: Self-pay

## 2023-03-05 ENCOUNTER — Other Ambulatory Visit (HOSPITAL_COMMUNITY): Payer: Self-pay

## 2023-03-05 ENCOUNTER — Telehealth: Payer: Self-pay

## 2023-03-05 NOTE — Telephone Encounter (Signed)
Pharmacy Patient Advocate Encounter  Received notification from [facility] that Prior Authorization for Celecoxib 200MG  capsules has been APPROVED from 03-05-2023 to 03-03-2024.Marland Kitchen  PA #/Case ID/Reference #: [id number]

## 2023-03-11 ENCOUNTER — Other Ambulatory Visit: Payer: Self-pay | Admitting: Family Medicine

## 2023-03-12 ENCOUNTER — Other Ambulatory Visit (HOSPITAL_COMMUNITY): Payer: Self-pay

## 2023-03-12 ENCOUNTER — Telehealth: Payer: Self-pay

## 2023-03-12 NOTE — Telephone Encounter (Signed)
Pharmacy Patient Advocate Encounter  Received notification from [facility] that Prior Authorization for [facility] 12.5MG /0.5ML pen-injectors has been DENIED. Please advise how you'd like to proceed. Full denial letter will be uploaded to the media tab. See denial reason below.  PA #/Case ID/Reference #: [id number]   DENIAL: Your request was denied because we did not see what we need to approve the drug  you asked for, [facility]). We may be able to approve this drug when you have tried a  certain other drug first (metformin). We do not see that you have tried this other drug  first, it didn't work for you or it caused you harm. If you have tried this other drug, we  may need more information (if we have received certain records; if you have tried and  cannot take certain drugs; if you have a certain illness; if you are taking certain other  drugs).

## 2023-03-12 NOTE — Telephone Encounter (Signed)
Pharmacy Patient Advocate Encounter   Received notification from CoverMyMeds that prior authorization for [facility] 12.5MG /0.5ML pen-injectors is required/requested.   Insurance verification completed.   The patient is insured through  [employer]  .   Per test claim: PA required; PA submitted to [employer] via CoverMyMeds Key/confirmation #/EOC  [facility] Status is pending

## 2023-04-13 ENCOUNTER — Other Ambulatory Visit: Payer: Self-pay | Admitting: Family Medicine

## 2023-04-20 NOTE — Telephone Encounter (Signed)
Patient called stating she got new insurance but doesn't have the card as of yet. Patient gave me member ID but plan not active. I will call her back to confirm if with [employer] as stated earlier or not. Has a PA been started for the [facility], if so what's the update?

## 2023-04-21 ENCOUNTER — Other Ambulatory Visit (HOSPITAL_COMMUNITY): Payer: Self-pay

## 2023-04-21 NOTE — Telephone Encounter (Signed)
I contacted patient stating I saw her plan with the member ID she gave me is not active. Patient verbalized understanding and states she will look into it further with her job.insurance company. States she will reach back out.

## 2023-04-21 NOTE — Telephone Encounter (Signed)
Per test claims-[employer] has a different DOB for this patient. Patient will need to reach out to correct this information.

## 2023-05-07 ENCOUNTER — Other Ambulatory Visit (HOSPITAL_COMMUNITY): Payer: Self-pay

## 2023-05-08 ENCOUNTER — Other Ambulatory Visit: Payer: Self-pay | Admitting: Family Medicine

## 2023-05-08 ENCOUNTER — Encounter: Payer: Self-pay | Admitting: Family Medicine

## 2023-05-10 MED ORDER — CYCLOBENZAPRINE HCL 10 MG PO TABS: 10.0000 mg | ORAL_TABLET | ORAL | 0 refills | Status: DC | PRN

## 2023-05-10 NOTE — Telephone Encounter (Signed)
Pt received new insurance and pharmacy needs new PA for  tirzepatide [facility]) 12.5 MG/0.5ML Pen  Please advise.

## 2023-05-11 NOTE — Telephone Encounter (Signed)
needs new PA for  tirzepatide [facility]) 12.5 MG/0.5ML Pen  Thanks Francena Hanly, RMA

## 2023-05-14 ENCOUNTER — Telehealth: Payer: Self-pay

## 2023-05-14 ENCOUNTER — Other Ambulatory Visit (HOSPITAL_COMMUNITY): Payer: Self-pay

## 2023-05-14 NOTE — Telephone Encounter (Signed)
Pharmacy Patient Advocate Encounter   Received notification from Patient Advice Request messages that prior authorization for [facility] 12.5MG /0.5ML auto-injectors is required/requested.   Insurance verification completed.   The patient is insured through [employer] .   Per test claim: PA required; PA submitted to [employer] via CoverMyMeds Key/confirmation #/EOC [id number] Status is pending

## 2023-05-14 NOTE — Telephone Encounter (Signed)
Pharmacy Patient Advocate Encounter  Received notification from [facility] that Prior Authorization for [facility] 12.5MG /0.5ML auto-injectors  has been DENIED.  Full denial letter will be uploaded to the media tab. See denial reason below.   PA #/Case ID/Reference #:  [id number]   DENIAL REASON: Must try Metformin first

## 2023-05-17 NOTE — Telephone Encounter (Signed)
Patient called to inform us that insurance company has informed her that PA has been denied. States they informed her the reason for denial was that they needed more information. I clarified for patient that denial was due to her needing to trail another medication first. Pt verbalized understanding and wanted to know what PCP wanted her to do next. Please Advise.

## 2023-05-18 ENCOUNTER — Other Ambulatory Visit: Payer: Self-pay | Admitting: Family Medicine

## 2023-06-10 ENCOUNTER — Other Ambulatory Visit (HOSPITAL_COMMUNITY): Payer: Self-pay

## 2023-06-29 ENCOUNTER — Encounter: Payer: Self-pay | Admitting: Family Medicine

## 2023-06-29 ENCOUNTER — Ambulatory Visit: Payer: BC Managed Care – PPO | Admitting: Family Medicine

## 2023-06-29 VITALS — BP 105/71 | HR 66 | Temp 96.6°F | Ht 65.0 in | Wt 228.2 lb

## 2023-06-29 DIAGNOSIS — E1169 Type 2 diabetes mellitus with other specified complication: Secondary | ICD-10-CM

## 2023-06-29 DIAGNOSIS — R062 Wheezing: Secondary | ICD-10-CM | POA: Diagnosis not present

## 2023-06-29 DIAGNOSIS — Z7984 Long term (current) use of oral hypoglycemic drugs: Secondary | ICD-10-CM | POA: Diagnosis not present

## 2023-06-29 DIAGNOSIS — Z7985 Long-term (current) use of injectable non-insulin antidiabetic drugs: Secondary | ICD-10-CM | POA: Diagnosis not present

## 2023-06-29 DIAGNOSIS — J309 Allergic rhinitis, unspecified: Secondary | ICD-10-CM

## 2023-06-29 DIAGNOSIS — M6740 Ganglion, unspecified site: Secondary | ICD-10-CM

## 2023-06-29 LAB — POCT GLYCOSYLATED HEMOGLOBIN (HGB A1C): Hemoglobin A1C: 5.3 % (ref 4.0–5.6)

## 2023-06-29 MED ORDER — CYCLOBENZAPRINE HCL 10 MG PO TABS: 10.0000 mg | ORAL_TABLET | ORAL | 0 refills | Status: DC | PRN

## 2023-06-29 MED ORDER — IPRATROPIUM BROMIDE HFA 17 MCG/ACT IN AERS: 2.0000 | INHALATION_SPRAY | Freq: Four times a day (QID) | RESPIRATORY_TRACT | 12 refills | Status: AC | PRN

## 2023-06-29 MED ORDER — METFORMIN HCL 500 MG PO TABS: 500.0000 mg | ORAL_TABLET | Freq: Every day | ORAL | 0 refills | Status: DC

## 2023-06-29 NOTE — Progress Notes (Signed)
   Tracy Wise is a 52 y.o. female who presents today for an office visit.  Assessment/Plan:  New/Acute Problems: Ganglion Cyst No red flags.  Will place referral to hand surgery to discuss management including aspiration and he is pretty corticosteroid injection versus excision.  Chronic Problems Addressed Today: T2DM (type 2 diabetes mellitus) (HCC) A1c has been well-controlled however insurance would not pay for [facility].  We will try metformin.  If she cannot tolerate this due to side effects we will go back to [facility].  A1c today is at goal.  Wheezing Reassuring today.  Has intermittent wheezing likely due to allergies.  May have some underlying reactive airway disease as well.  She does have albuterol to use as needed however does not want to use this at night.  Will try ipratropium and hopefully this will not have as many side effects.  She will let us know if not improving and we can refer to pulmonology for PFTs.   Allergic rhinitis Mild flare recently though symptoms are stable.  Continue Xyzal.    Subjective:  HPI:  See A/P for test for chronic conditions.  Patient is here today for follow-up.  Last seen 6 months ago.  At that time her A1c was 5.4.  We increased Mounjaro to 12.5 mg weekly.  Since our last visit she did switch insurance and they are no longer covering this. She is currently paying out of pocket for the 10 mg vial.  She has been doing reasonably well with this.  Her insurance told her that she needed to try metformin before they will pay for [facility].  She has also had a flareup of her allergies and wheezing for the last couple of weeks.  Symptoms are worse at night when trying to go to sleep.  She does have an albuterol inhaler however she is not use this at night due to it causing some issues with insomnia.  No shortness of breath.  No chest pain.  No fevers or chills.  No recent illnesses.  She is also noticed a cyst on her right third finger for the last  several months.  She did try to drain this and a jelly like fluid came out.         Objective:  Physical Exam: BP 105/71   Pulse 66   Temp (!) 96.6 F (35.9 C) (Temporal)   Ht 5\' 5"  (1.651 m)   Wt 228 lb 3.2 oz (103.5 kg)   BMI 37.97 kg/m   Wt Readings from Last 3 Encounters:  06/29/23 228 lb 3.2 oz (103.5 kg)  12/24/22 222 lb 9.6 oz (101 kg)  09/22/22 226 lb 8 oz (102.7 kg)    Gen: No acute distress, resting comfortably CV: Regular rate and rhythm with no murmurs appreciated Pulm: Normal work of breathing, clear to auscultation bilaterally with no crackles, wheezes, or rhonchi MUSCULOSKELETAL: Ganglion cyst noted on right third DIP Neuro: Grossly normal, moves all extremities Psych: Normal affect and thought content      Dalaney Needle M. Jimmey Ralph, MD 06/29/2023 11:25 AM

## 2023-06-29 NOTE — Assessment & Plan Note (Signed)
Reassuring today.  Has intermittent wheezing likely due to allergies.  May have some underlying reactive airway disease as well.  She does have albuterol to use as needed however does not want to use this at night.  Will try ipratropium and hopefully this will not have as many side effects.  She will let us know if not improving and we can refer to pulmonology for PFTs.

## 2023-06-29 NOTE — Assessment & Plan Note (Signed)
Mild flare recently though symptoms are stable.  Continue Xyzal.

## 2023-06-29 NOTE — Patient Instructions (Signed)
It was very nice to see you today!  We will try metformin.  Send me a message to let me know how this is working for you.  Please try the ipratropium to help with your wheezing.  Let me know if not improving.  I will refer you to see the hand surgeon.  Return in about 6 months (around 12/27/2023) for Follow Up.   Take care, Dr Jimmey Ralph  PLEASE NOTE:  If you had any lab tests, please let us know if you have not heard back within a few days. You may see your results on mychart before we have a chance to review them but we will give you a call once they are reviewed by [place].   If we ordered any referrals today, please let us know if you have not heard from their office within the next week.   If you had any urgent prescriptions sent in today, please check with the pharmacy within an hour of our visit to make sure the prescription was transmitted appropriately.   Please try these tips to maintain a healthy lifestyle:  Eat at least 3 REAL meals and 1-2 snacks per day.  Aim for no more than 5 hours between eating.  If you eat breakfast, please do so within one hour of getting up.   Each meal should contain half fruits/vegetables, one quarter protein, and one quarter carbs (no bigger than a computer mouse)  Cut down on sweet beverages. This includes juice, soda, and sweet tea.   Drink at least 1 glass of water with each meal and aim for at least 8 glasses per day  Exercise at least 150 minutes every week.

## 2023-06-29 NOTE — Assessment & Plan Note (Signed)
A1c has been well-controlled however insurance would not pay for [facility].  We will try metformin.  If she cannot tolerate this due to side effects we will go back to [facility].  A1c today is at goal.

## 2023-07-19 ENCOUNTER — Other Ambulatory Visit: Payer: Self-pay | Admitting: Family Medicine

## 2023-07-21 ENCOUNTER — Other Ambulatory Visit: Payer: Self-pay | Admitting: Family Medicine

## 2023-07-24 ENCOUNTER — Other Ambulatory Visit: Payer: Self-pay | Admitting: Family Medicine

## 2023-07-28 ENCOUNTER — Encounter: Payer: Self-pay | Admitting: Family Medicine

## 2023-07-29 ENCOUNTER — Other Ambulatory Visit: Payer: Self-pay | Admitting: *Deleted

## 2023-07-29 MED ORDER — TIRZEPATIDE 2.5 MG/0.5ML ~~LOC~~ SOAJ: 2.5000 mg | SUBCUTANEOUS | 1 refills | Status: DC

## 2023-07-29 NOTE — Telephone Encounter (Signed)
Recommend we go back to [facility] if she is agreeable.  Please send in Tracy Wise 2.5 mg weekly.  We should do this for 4 weeks and then increase to 5 mg weekly.  We can complete her authorization as needed due to her failing metformin.  Tracy Wise. Jimmey Ralph, MD 07/29/2023 1:05 PM

## 2023-07-30 NOTE — Telephone Encounter (Signed)
RX Sent.

## 2023-09-14 ENCOUNTER — Other Ambulatory Visit: Payer: Self-pay | Admitting: *Deleted

## 2023-09-14 ENCOUNTER — Encounter: Payer: Self-pay | Admitting: Family Medicine

## 2023-09-14 MED ORDER — TIRZEPATIDE 2.5 MG/0.5ML ~~LOC~~ SOAJ: 2.5000 mg | SUBCUTANEOUS | 1 refills | Status: DC

## 2023-09-14 NOTE — Telephone Encounter (Signed)
Called patient notified Rx was send on 07/29/2023 [facility]

## 2023-09-17 ENCOUNTER — Other Ambulatory Visit: Payer: Self-pay | Admitting: Family Medicine

## 2023-11-04 DIAGNOSIS — M1812 Unilateral primary osteoarthritis of first carpometacarpal joint, left hand: Secondary | ICD-10-CM | POA: Diagnosis not present

## 2023-11-04 DIAGNOSIS — M67441 Ganglion, right hand: Secondary | ICD-10-CM | POA: Diagnosis not present

## 2023-11-25 DIAGNOSIS — M1812 Unilateral primary osteoarthritis of first carpometacarpal joint, left hand: Secondary | ICD-10-CM | POA: Diagnosis not present

## 2023-11-30 ENCOUNTER — Other Ambulatory Visit: Payer: Self-pay | Admitting: Family Medicine

## 2023-11-30 MED ORDER — CYCLOBENZAPRINE HCL 10 MG PO TABS: 10.0000 mg | ORAL_TABLET | ORAL | 0 refills | Status: DC | PRN

## 2023-11-30 MED ORDER — RIZATRIPTAN BENZOATE 10 MG PO TABS: 10.0000 mg | ORAL_TABLET | ORAL | 0 refills | Status: AC | PRN

## 2023-11-30 MED ORDER — PROMETHAZINE HCL 25 MG PO TABS: 25.0000 mg | ORAL_TABLET | Freq: Four times a day (QID) | ORAL | 0 refills | Status: DC | PRN

## 2023-11-30 MED ORDER — CELECOXIB 200 MG PO CAPS: 200.0000 mg | ORAL_CAPSULE | Freq: Two times a day (BID) | ORAL | 0 refills | Status: DC

## 2023-11-30 NOTE — Telephone Encounter (Signed)
 Copied from CRM [id number]. Topic: Clinical - Medication Refill >> Nov 30, 2023 12:13 PM Orien Bird wrote: Most Recent Primary Care Visit:  Provider: Rodney Clamp  Department: [facility]  Visit Type: OFFICE VISIT  Date: 06/29/2023  Medication: cyclobenzaprine (FLEXERIL) 10 MG tablet, promethazine (PHENERGAN) 25 MG tablet, rizatriptan (MAXALT) 10 MG tablet, and celecoxib (CELEBREX) 200 MG capsule  Has the patient contacted their pharmacy? Yes (Agent: If no, request that the patient contact the pharmacy for the refill. If patient does not wish to contact the pharmacy document the reason why and proceed with request.) (Agent: If yes, when and what did the pharmacy advise?)  Is this the correct pharmacy for this prescription? Yes If no, delete pharmacy and type the correct one.  This is the patient's preferred pharmacy:  [id number] [place], [place] - [address] AT [facility] [address] [place] [address] Phone: [phone] Fax: [phone]  [id number] [place] [place] - [address] AT [address] [place] [address] Phone: [phone] Fax: [phone]   Has the prescription been filled recently? No  Is the patient out of the medication? Yes  Has the patient been seen for an appointment in the last year OR does the patient have an upcoming appointment? Yes  Can we respond through MyChart? Yes  Agent: Please be advised that Rx refills may take up to 3 business days. We ask that you follow-up with your pharmacy.

## 2023-12-01 NOTE — Telephone Encounter (Signed)
Please start PA thanks.

## 2023-12-07 ENCOUNTER — Other Ambulatory Visit (HOSPITAL_COMMUNITY): Payer: Self-pay

## 2023-12-07 ENCOUNTER — Telehealth: Payer: Self-pay

## 2023-12-07 NOTE — Telephone Encounter (Signed)
 Pharmacy Patient Advocate Encounter   Received notification from CoverMyMeds that prior authorization for [facility]  3MG  tablets is required/requested.   Insurance verification completed.   The patient is insured through [facility] Medicare .   Per test claim: The current 30 day co-pay is, $25.00.  No PA needed at this time. This test claim was processed through [facility]- copay amounts may vary at other pharmacies due to pharmacy/plan contracts, or as the patient moves through the different stages of their insurance plan.

## 2023-12-28 ENCOUNTER — Ambulatory Visit (HOSPITAL_COMMUNITY)
Admission: RE | Admit: 2023-12-28 | Discharge: 2023-12-28 | Disposition: A | Discharge status: Home / Self Care | Source: Ambulatory Visit | Attending: Family Medicine | Admitting: Family Medicine

## 2023-12-28 ENCOUNTER — Encounter: Payer: Self-pay | Admitting: Family Medicine

## 2023-12-28 ENCOUNTER — Ambulatory Visit: Payer: BC Managed Care – PPO | Admitting: Family Medicine

## 2023-12-28 VITALS — BP 107/70 | HR 69 | Temp 97.2°F | Ht 65.0 in | Wt 238.6 lb

## 2023-12-28 DIAGNOSIS — E1169 Type 2 diabetes mellitus with other specified complication: Secondary | ICD-10-CM | POA: Diagnosis not present

## 2023-12-28 DIAGNOSIS — R300 Dysuria: Secondary | ICD-10-CM

## 2023-12-28 DIAGNOSIS — Z7985 Long-term (current) use of injectable non-insulin antidiabetic drugs: Secondary | ICD-10-CM

## 2023-12-28 DIAGNOSIS — Z13220 Encounter for screening for lipoid disorders: Secondary | ICD-10-CM | POA: Diagnosis not present

## 2023-12-28 DIAGNOSIS — R1012 Left upper quadrant pain: Secondary | ICD-10-CM

## 2023-12-28 DIAGNOSIS — R42 Dizziness and giddiness: Secondary | ICD-10-CM

## 2023-12-28 LAB — POCT URINALYSIS DIPSTICK
Bilirubin, UA: NEGATIVE
Blood, UA: POSITIVE
Glucose, UA: NEGATIVE
Ketones, UA: NEGATIVE
Nitrite, UA: NEGATIVE
Protein, UA: POSITIVE — AB
Spec Grav, UA: 1.02 (ref 1.010–1.025)
Urobilinogen, UA: 0.2 U/dL
pH, UA: 6 (ref 5.0–8.0)

## 2023-12-28 LAB — LIPID PANEL
Cholesterol: 164 mg/dL (ref 0–200)
HDL: 49.1 mg/dL (ref >39.00)
LDL Cholesterol: 104 mg/dL — ABNORMAL HIGH (ref 0–99)
NonHDL: 114.44
Total CHOL/HDL Ratio: 3
Triglycerides: 52 mg/dL (ref 0.0–149.0)
VLDL: 10.4 mg/dL (ref 0.0–40.0)

## 2023-12-28 LAB — CBC
HCT: 41.9 % (ref 36.0–46.0)
Hemoglobin: 13.3 g/dL (ref 12.0–15.0)
MCHC: 31.8 g/dL (ref 30.0–36.0)
MCV: 84.5 fl (ref 78.0–100.0)
Platelets: 207 10*3/uL (ref 150.0–400.0)
RBC: 4.96 Mil/uL (ref 3.87–5.11)
RDW: 14.5 % (ref 11.5–15.5)
WBC: 5.6 10*3/uL (ref 4.0–10.5)

## 2023-12-28 LAB — COMPREHENSIVE METABOLIC PANEL WITH GFR
ALT: 12 U/L (ref 0–35)
AST: 13 U/L (ref 0–37)
Albumin: 4 g/dL (ref 3.5–5.2)
Alkaline Phosphatase: 90 U/L (ref 39–117)
BUN: 12 mg/dL (ref 6–23)
CO2: 30 meq/L (ref 19–32)
Calcium: 9 mg/dL (ref 8.4–10.5)
Chloride: 103 meq/L (ref 96–112)
Creatinine, Ser: 0.76 mg/dL (ref 0.40–1.20)
GFR: 88.64 mL/min (ref >60.00)
Glucose, Bld: 93 mg/dL (ref 70–99)
Potassium: 4.4 meq/L (ref 3.5–5.1)
Sodium: 140 meq/L (ref 135–145)
Total Bilirubin: 0.4 mg/dL (ref 0.2–1.2)
Total Protein: 7.1 g/dL (ref 6.0–8.3)

## 2023-12-28 LAB — TSH: TSH: 1.39 u[IU]/mL (ref 0.35–5.50)

## 2023-12-28 LAB — MICROALBUMIN / CREATININE URINE RATIO
Creatinine,U: 149.5 mg/dL
Microalb Creat Ratio: 75.2 mg/g — ABNORMAL HIGH (ref 0.0–30.0)
Microalb, Ur: 11.3 mg/dL — ABNORMAL HIGH (ref 0.0–1.9)

## 2023-12-28 LAB — HEMOGLOBIN A1C: Hgb A1c MFr Bld: 6.1 % (ref 4.6–6.5)

## 2023-12-28 MED ORDER — TIRZEPATIDE 5 MG/0.5ML ~~LOC~~ SOAJ: 5.0000 mg | SUBCUTANEOUS | 0 refills | Status: DC

## 2023-12-28 MED ORDER — PROMETHAZINE HCL 25 MG PO TABS: 25.0000 mg | ORAL_TABLET | Freq: Four times a day (QID) | ORAL | 0 refills | Status: AC | PRN

## 2023-12-28 MED ORDER — MECLIZINE HCL 25 MG PO TABS: 25.0000 mg | ORAL_TABLET | Freq: Three times a day (TID) | ORAL | 0 refills | Status: AC | PRN

## 2023-12-28 MED ORDER — CIPROFLOXACIN HCL 500 MG PO TABS: 500.0000 mg | ORAL_TABLET | Freq: Two times a day (BID) | ORAL | 0 refills | Status: AC

## 2023-12-28 MED ORDER — CELECOXIB 200 MG PO CAPS: 200.0000 mg | ORAL_CAPSULE | Freq: Two times a day (BID) | ORAL | 0 refills | Status: DC

## 2023-12-28 NOTE — Patient Instructions (Signed)
 It was very nice to see you today!  I am concerned that she may have a kidney stone.  We will start Cipro  to treat any potential UTI while we are waiting on your test results to come back.  We will check a stat CT scan to determine the size and location of your kidney stone.  I will send a prescription in for meclizine to help with your vertigo.  Please let us  know if this becomes more of a persistent issue.  We will increase your Mounjaro  to 5 mg weekly.  Let me know in a few weeks how this is working and we can adjust the dose as needed.  Return in about 6 months (around 06/29/2024).   Take care, Dr Daneil Dunker  PLEASE NOTE:  If you had any lab tests, please let us  know if you have not heard back within a few days. You may see your results on mychart before we have a chance to review them but we will give you a call once they are reviewed by us .   If we ordered any referrals today, please let us  know if you have not heard from their office within the next week.   If you had any urgent prescriptions sent in today, please check with the pharmacy within an hour of our visit to make sure the prescription was transmitted appropriately.   Please try these tips to maintain a healthy lifestyle:  Eat at least 3 REAL meals and 1-2 snacks per day.  Aim for no more than 5 hours between eating.  If you eat breakfast, please do so within one hour of getting up.   Each meal should contain half fruits/vegetables, one quarter protein, and one quarter carbs (no bigger than a computer mouse)  Cut down on sweet beverages. This includes juice, soda, and sweet tea.   Drink at least 1 glass of water with each meal and aim for at least 8 glasses per day  Exercise at least 150 minutes every week.

## 2023-12-28 NOTE — Progress Notes (Signed)
 Tracy Wise is a 53 y.o. female who presents today for an office visit.  Assessment/Plan:  New/Acute Problems: Dysuria / Left Flank Pain  UA with positive leukocytes and RBCs.  History is concerning for recurrent nephrolithiasis however given that symptoms have been persistent for the last couple of weeks with dysuria and urinary urgency there is also concern for complicated UTI.  Will start Cipro  500 mg twice daily while we await culture results.  Her CT scan 2 years ago did demonstrate nonobstructive nephrolithiasis in her left kidney.  Given that symptoms have been persistent for the last couple of weeks we will also check a stat CT scan to rule out nephrolithiasis.  Check labs today including CBC and c-Met.  Depending on results of above may need referral to see nephrology.  We discussed reasons to return to care and seek emergent care.  Chronic Problems Addressed Today: Vertigo She is currently asymptomatic.  She has had intermittent vertigo flares over the last few months.  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  She has a normal neurologic exam today.  We will check labs today however given that she is currently asymptomatic with a normal neurologic exam do not think we need to pursue any further workup at this time.  Will refill her meclizine which she has done well with in the past.  We did discuss referral to see vestibular rehab however she would like to hold off on this for now.  We discussed reasons to return to care.  She will let us  know if symptoms return or become more persistent and will pursue further workup at that time including referral to neurology or imaging.  T2DM (type 2 diabetes mellitus) (HCC) Check A1c with labs.  Will increase Mounjaro  to 5 mg weekly.  She is doing well with this.  She will follow-up with us  in a few weeks to monitor and we can titrate as needed.  Preventive healthcare Advised patient that she is due for vaccines including shingles and  pneumonia vaccine.  Also due for mammogram.  She would like to focus on addressing more urgent concerns as above however will get these done soon.  We can discuss more at next office visit.    Subjective:  HPI:  See A/P for status of chronic conditions.  Patient is here today for follow-up.  I last saw her 6 months ago.  At that time her A1c was controlled.  Since her last visit we did start her on Mounjaro .  She is currently on 2.5 mg weekly.  Doing well though is interested in increasing the dose.  She is tolerated well without any significant side effects.  She has a few additional issues that she would like to discuss today.  Her primary concern was dysuria and urgency.  This started a couple of weeks ago.  Symptoms were happening intermittently.  She was concerned about UTI however over the last day or so she has noticed more severe and persistent pain in her left lower quadrant and left flank.  She did have a kidney stone last year and she is worried about recurrence.  She has had occasional nausea.  No fevers or chills.  No specific treatment tried.  She has noticed more blood in her urine since today.  She has also had intermittent dizziness for the last few months.  This is usually episodic in nature.  Usually happens when waking up and going from sitting to standing position.  She  has been diagnosed with BPPV in the past and has been on meclizine which has helped with her symptoms.  She has not had any recurrence for several years.  Currently does not have any dizziness.  She has not had any dizziness for the last few days.  No weakness or numbness.  Describes an off-balance sensation.  Feels like her vision goes crooked.  Has a room spinning sensation.  She has not had any syncopal episodes.  She is also been diagnosed with orthostatic hypotension in the past.  She is trying to stay well-hydrated.         Objective:  Physical Exam: BP 107/70   Pulse 69   Temp (!) 97.2 F (36.2 C)  (Temporal)   Ht 5\' 5"  (1.651 m)   Wt 238 lb 9.6 oz (108.2 kg)   SpO2 100%   BMI 39.71 kg/m   Gen: No acute distress, resting comfortably HEENT: Left tympanostomy tube in place.  Right TM with chronic perforation.  No signs of infection. CV: Regular rate and rhythm with no murmurs appreciated Pulm: Normal work of breathing, clear to auscultation bilaterally with no crackles, wheezes, or rhonchi MUSCULOSKELETAL: Left CVA tenderness noted. Neuro: Cranial nerves II through XII intact.  Finger-nose-finger testing intact bilaterally.  Neurovascularly intact distally in all extremities.  Sensation light touch intact throughout.  Strength 5 out of 5 in upper and lower extremities.  Dix-Hallpike deferred. Psych: Normal affect and thought content  Time Spent: 50 minutes of total time was spent on the date of the encounter performing the following actions: chart review prior to seeing the patient including previous imaging and visit with urgent care, obtaining history, performing a medically necessary exam, counseling on the treatment plan, placing orders, and documenting in our EHR.        Jinny Mounts. Daneil Dunker, MD 12/28/2023 12:11 PM

## 2023-12-28 NOTE — Assessment & Plan Note (Signed)
 Check A1c with labs.  Will increase Mounjaro  to 5 mg weekly.  She is doing well with this.  She will follow-up with us  in a few weeks to monitor and we can titrate as needed.

## 2023-12-28 NOTE — Assessment & Plan Note (Signed)
 She is currently asymptomatic.  She has had intermittent vertigo flares over the last few months.  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  She has a normal neurologic exam today.  We will check labs today however given that she is currently asymptomatic with a normal neurologic exam do not think we need to pursue any further workup at this time.  Will refill her meclizine which she has done well with in the past.  We did discuss referral to see vestibular rehab however she would like to hold off on this for now.  We discussed reasons to return to care.  She will let us  know if symptoms return or become more persistent and will pursue further workup at that time including referral to neurology or imaging.

## 2023-12-29 ENCOUNTER — Ambulatory Visit: Payer: Self-pay | Admitting: Family Medicine

## 2023-12-29 LAB — URINE CULTURE
MICRO NUMBER:: 16448890
SPECIMEN QUALITY:: ADEQUATE

## 2023-12-29 NOTE — Progress Notes (Signed)
 Her blood work is all stable.  Mildly elevated cholesterol and blood sugar levels but better than previous values.  She does have a little bit of extra protein in her urine however this is likely due to her recent kidney stones.  We can recheck her urine test at her next office visit here.

## 2023-12-29 NOTE — Progress Notes (Signed)
 Her CT scan showed that she does have small kidney stones in both of her kidneys however these should not be causing any issues.  There was a small stone in her urinary bladder that looks like it was just recently passed.  I believe this was likely the source of her pain.  Hopefully her pain is improving by now.  There was also a couple of small hernias in her abdomen as well.  Recommend referral to surgery for further evaluation for these.  She should let us  know if her pain is not improving.

## 2023-12-30 ENCOUNTER — Other Ambulatory Visit: Payer: Self-pay | Admitting: Family Medicine

## 2023-12-30 NOTE — Progress Notes (Signed)
 Her urine culture is negative.  It is okay for her to discontinue antibiotics.  She should let us  know if her symptoms are not improving.

## 2023-12-30 NOTE — Telephone Encounter (Unsigned)
 Copied from CRM [id number]. Topic: Clinical - Medication Refill >> Dec 30, 2023 11:22 AM Adonis Hoot wrote: Medication: cyclobenzaprine  (FLEXERIL ) 10 MG tablet  Has the patient contacted their pharmacy? Yes (Agent: If no, request that the patient contact the pharmacy for the refill. If patient does not wish to contact the pharmacy document the reason why and proceed with request.) (Agent: If yes, when and what did the pharmacy advise?)  This is the patient's preferred pharmacy:  [id number] [place], [place] - [address] AT [facility] [address] [place] [address] Phone: [phone] Fax: [phone]    Is this the correct pharmacy for this prescription? Yes If no, delete pharmacy and type the correct one.   Has the prescription been filled recently? Yes  Is the patient out of the medication? No(have a few)  Has the patient been seen for an appointment in the last year OR does the patient have an upcoming appointment? Yes  Can we respond through MyChart? Yes  Agent: Please be advised that Rx refills may take up to 3 business days. We ask that you follow-up with your pharmacy.

## 2023-12-31 ENCOUNTER — Other Ambulatory Visit: Payer: Self-pay | Admitting: Family Medicine

## 2024-01-03 ENCOUNTER — Telehealth: Payer: Self-pay

## 2024-01-03 ENCOUNTER — Other Ambulatory Visit (HOSPITAL_COMMUNITY): Payer: Self-pay

## 2024-01-03 NOTE — Telephone Encounter (Signed)
 Pharmacy Patient Advocate Encounter   Received notification from Onbase that prior authorization for Mounjaro  5MG /0.5ML auto-injectors is required/requested.   Insurance verification completed.   The patient is insured through [facility] .   Per test claim: PA required; PA submitted to above mentioned insurance via CoverMyMeds Key/confirmation #/EOC [id number] Status is pending

## 2024-01-05 ENCOUNTER — Other Ambulatory Visit (HOSPITAL_COMMUNITY): Payer: Self-pay

## 2024-01-05 NOTE — Telephone Encounter (Signed)
 Pharmacy Patient Advocate Encounter  Received notification from [employer] that Prior Authorization for Mounjaro  5MG /0.5ML auto-injectors has been APPROVED from 01/03/24 to 01/02/25   PA #/Case ID/Reference #: [id number]

## 2024-01-23 ENCOUNTER — Other Ambulatory Visit: Payer: Self-pay | Admitting: Family Medicine

## 2024-01-29 ENCOUNTER — Other Ambulatory Visit: Payer: Self-pay | Admitting: Family Medicine

## 2024-02-08 ENCOUNTER — Other Ambulatory Visit (HOSPITAL_COMMUNITY): Payer: Self-pay

## 2024-02-14 ENCOUNTER — Other Ambulatory Visit (HOSPITAL_COMMUNITY): Payer: Self-pay

## 2024-02-16 IMAGING — CT CT ABD-PELV W/ CM
2 of 5 series · 16 of 46 positions shown, 18 images · IV contrast (Omnipaque or Isovue)
Comparison: None Available.

CLINICAL DATA: Epigastric pain

EXAM:
CT ABDOMEN AND PELVIS WITH CONTRAST
TECHNIQUE: Multidetector CT imaging of the abdomen and pelvis was performed
using the standard protocol following bolus administration of
intravenous contrast.

[Series 2: axial (person_name) · axial · 0.98mm/px · z∈[+905,+1330]mm · 13 of 97 slices shown, 15 images]
[im 6/97  soft-tissue]
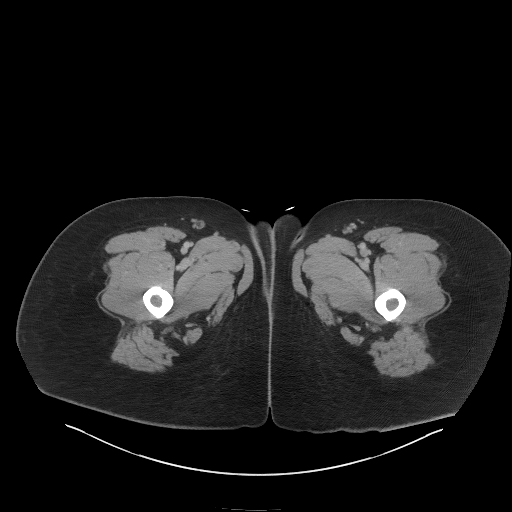
[im 6/97  bone]
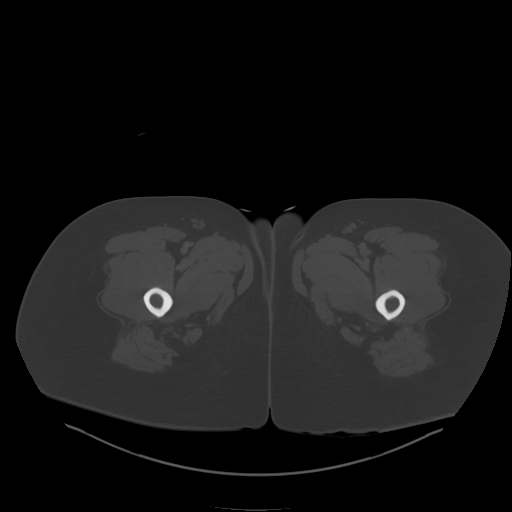
[im 11/97  soft-tissue]
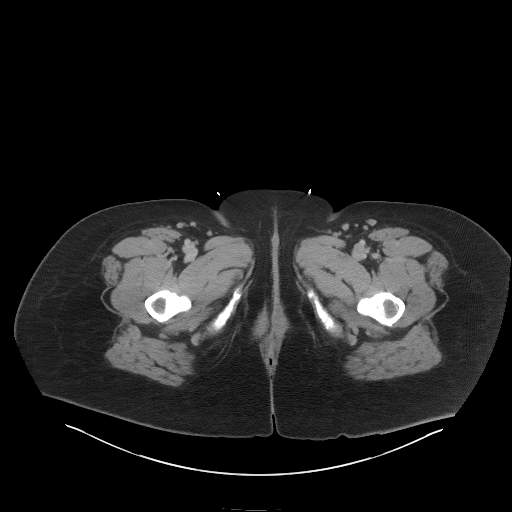
[im 22/97  soft-tissue]
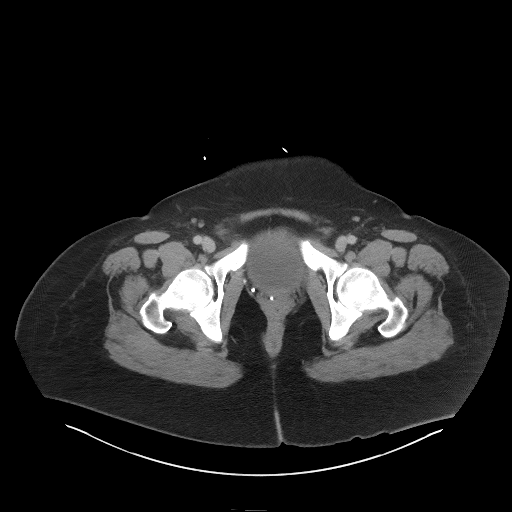
[im 27/97  soft-tissue]
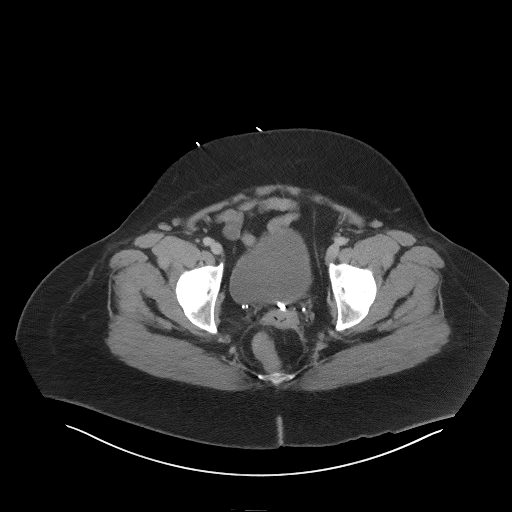
[im 33/97  soft-tissue]
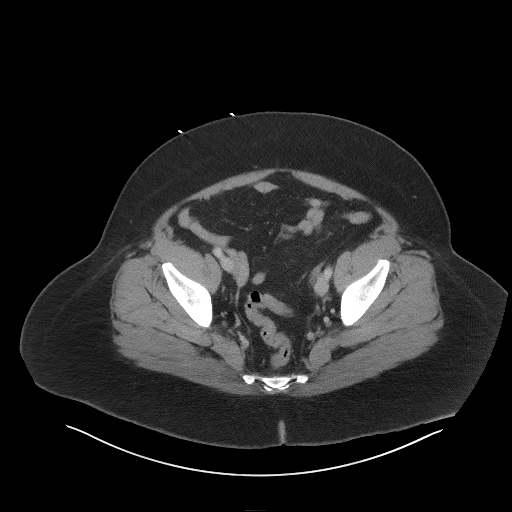
[im 43/97  soft-tissue]
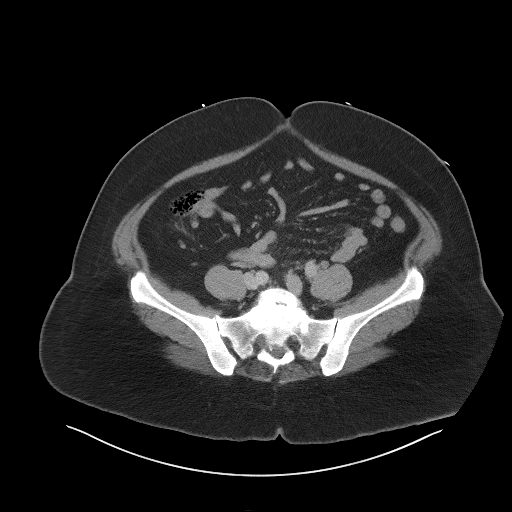
[im 49/97  soft-tissue]
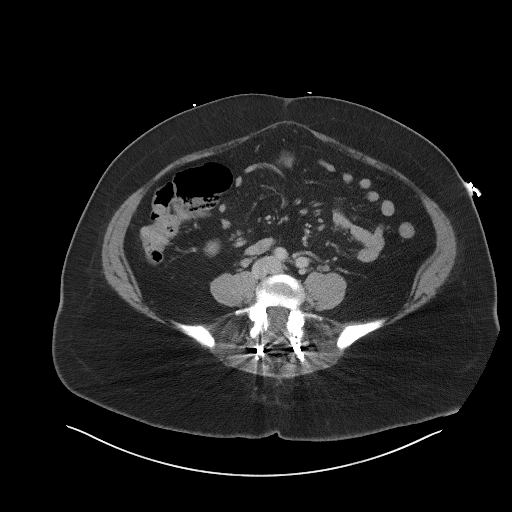
[im 54/97  soft-tissue]
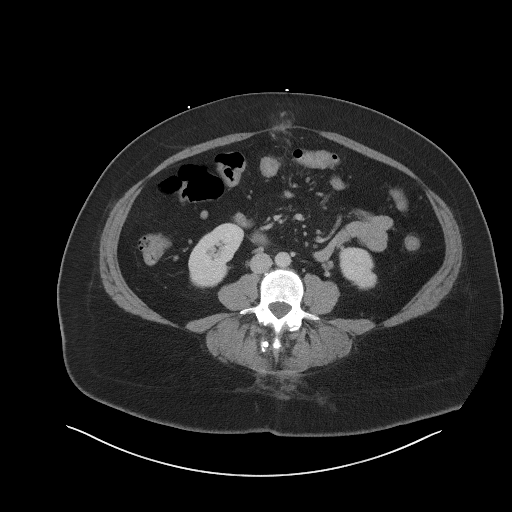
[im 65/97  soft-tissue]
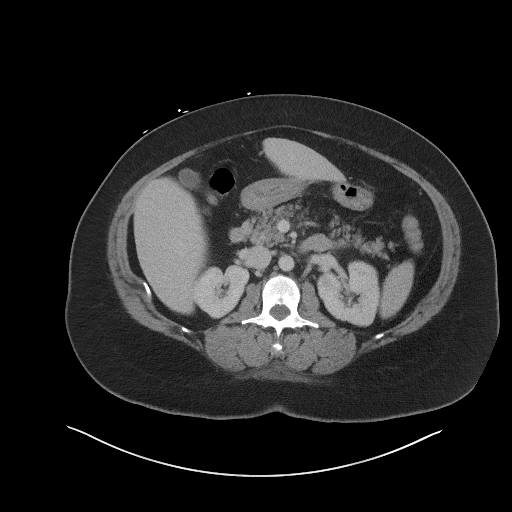
[im 65/97  bone]
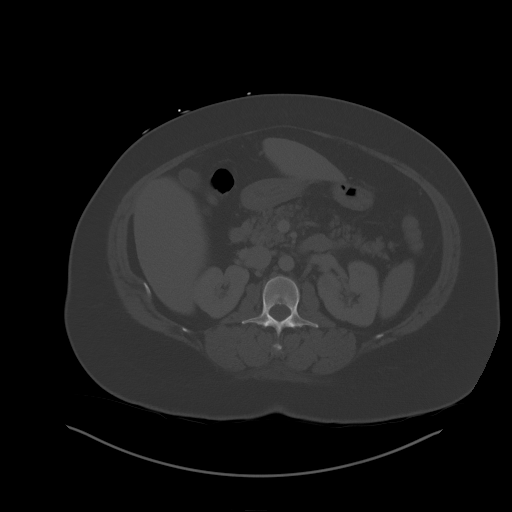
[im 70/97  soft-tissue]
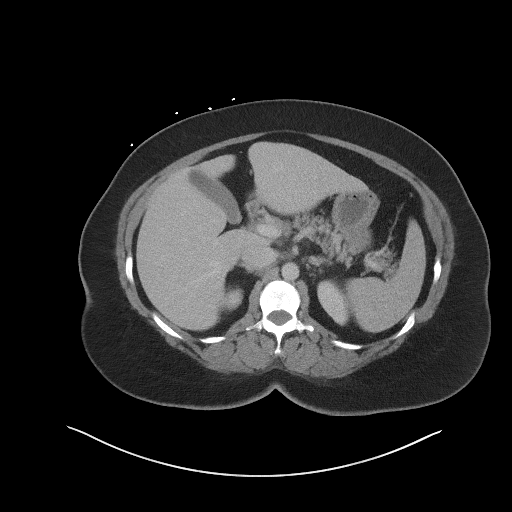
[im 75/97  soft-tissue]
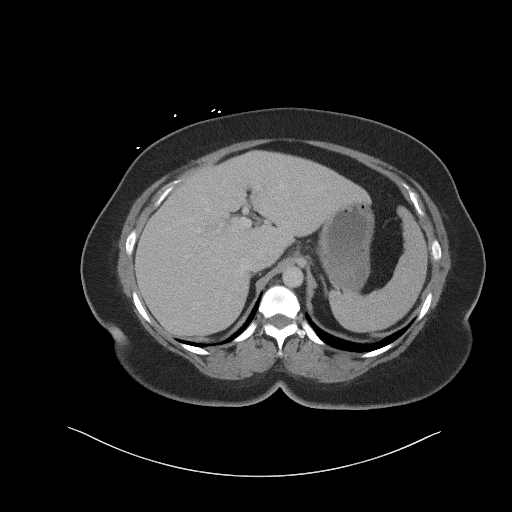
[im 86/97  soft-tissue]
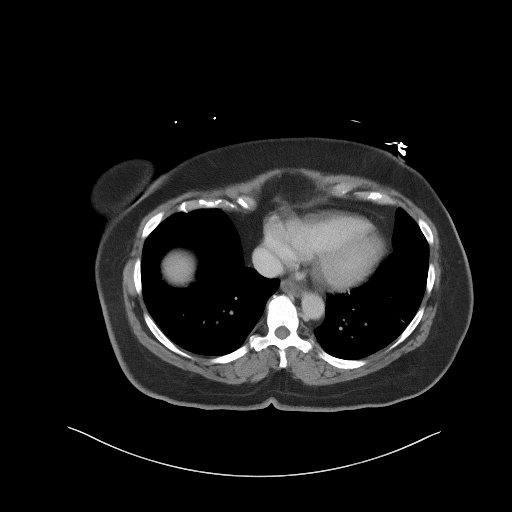
[im 91/97  soft-tissue]
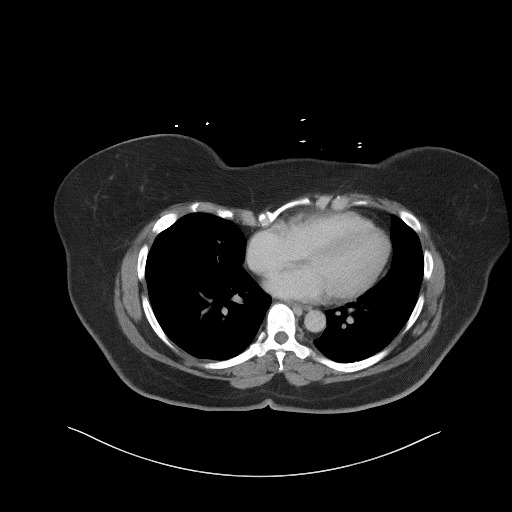

[Series 5: coronal (person_name) · coronal · 0.95mm/px · 3 of 128 slices shown]
[im 43/128  soft-tissue]
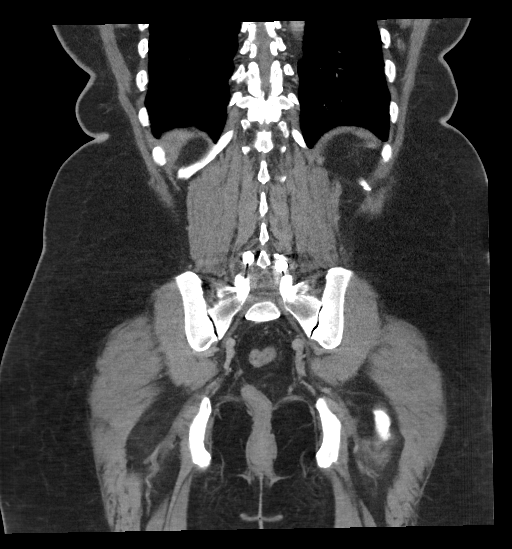
[im 57/128  soft-tissue]
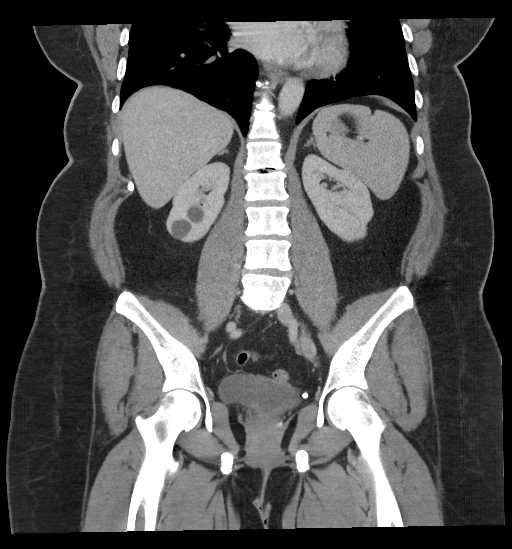
[im 71/128  soft-tissue]
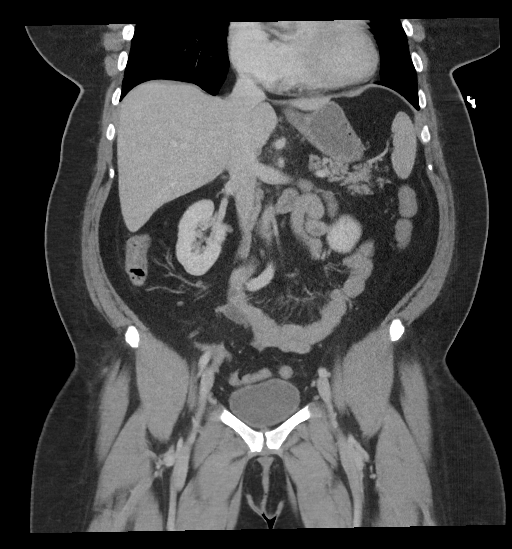

[16 of 46 positions shown; findings below may reference images not displayed]

RADIATION DOSE REDUCTION: This exam was performed according to the
departmental dose-optimization program which includes automated
exposure control, adjustment of the mA and/or kV according to
patient size and/or use of iterative reconstruction technique.

CONTRAST:  100mL OMNIPAQUE IOHEXOL 300 MG/ML  SOLN
FINDINGS: Lower chest: No acute abnormality.

Hepatobiliary: Focal fatty deposition adjacent to the falciform
ligament. Gallbladder is unremarkable. Portal vein is patent. No
intrahepatic or extrahepatic biliary ductal dilation.

Pancreas: Unremarkable. No pancreatic ductal dilatation or
surrounding inflammatory changes.

Spleen: Normal in size without focal abnormality.

Adrenals/Urinary Tract: Adrenal glands are unremarkable. Multiple
bilateral nonobstructing nephrolithiasis. Kidneys enhance
symmetrically. RIGHT-sided renal cysts (for which no dedicated
imaging follow-up is recommended). Bladder is unremarkable.

Stomach/Bowel: No evidence of bowel obstruction. Appendix is normal.
There is mild prominence of the stomach walls near the region of the
pylorus.

Vascular/Lymphatic: No significant vascular findings are present. No
enlarged abdominal or pelvic lymph nodes.

Reproductive: Status post hysterectomy. No adnexal masses.

Other: Fat containing ventral hernia in the midline above the level
of the umbilicus. There is a small amount of fat stranding. No free
air or free fluid.

Musculoskeletal: Status post posterior fixation of L5-S1. Bilateral
pars defects at this level.
IMPRESSION: 1. Mild prominence of the gastric walls. This could be accentuated
by under distension findings could reflect a nonspecific gastritis
in the appropriate clinical setting.
2. Small fat containing ventral hernia with small amount of adjacent
fat stranding.

## 2024-02-18 IMAGING — CT CT ABD-PELV W/ CM
2 of 5 series · 17 of 46 positions shown, 19 images · IV contrast (Omni 300)
Comparison: 01/18/2022

CLINICAL DATA: Nausea and vomiting with acute abdominal pain

EXAM:
CT ABDOMEN AND PELVIS WITH CONTRAST
TECHNIQUE: Multidetector CT imaging of the abdomen and pelvis was performed
using the standard protocol following bolus administration of
intravenous contrast.

[Series 3: a/p w/ 5mm · axial · 0.98mm/px · z∈[+747,+1122]mm · 14 of 85 slices shown, 16 images]
[im 5/85  soft-tissue]
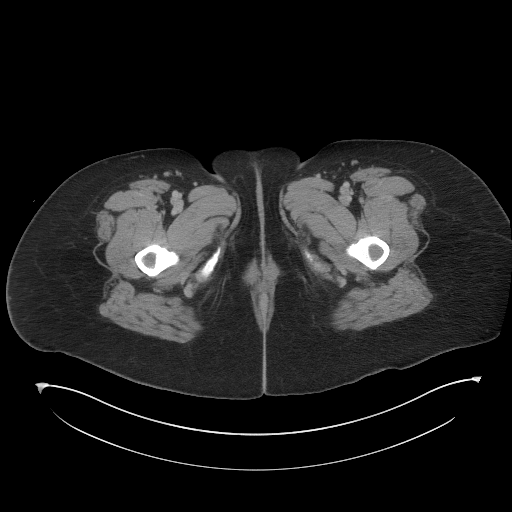
[im 5/85  bone]
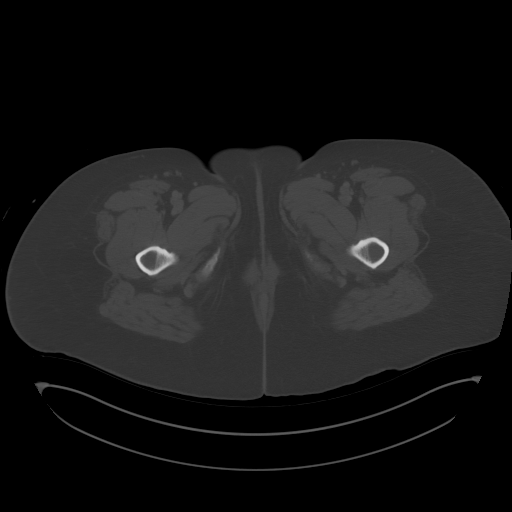
[im 9/85  soft-tissue]
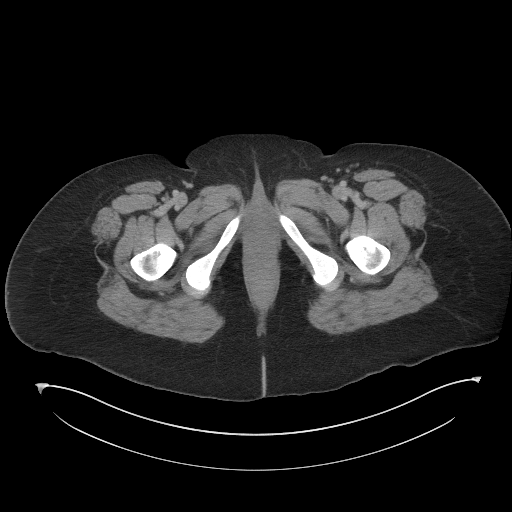
[im 18/85  soft-tissue]
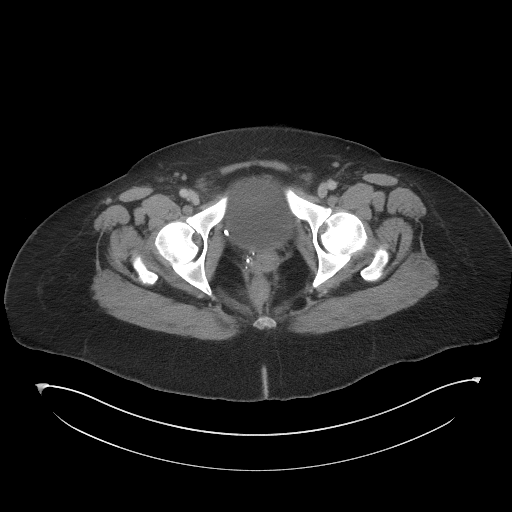
[im 23/85  soft-tissue]
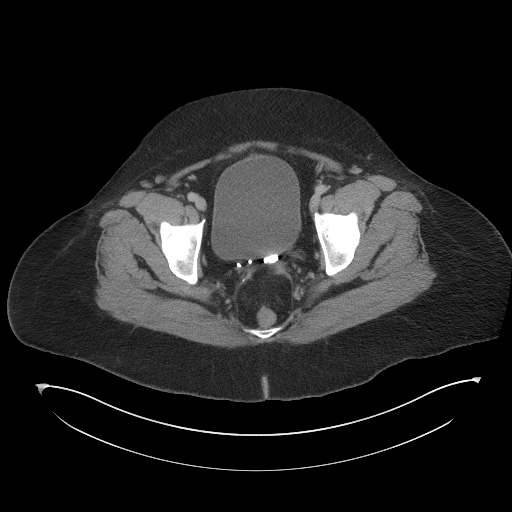
[im 27/85  soft-tissue]
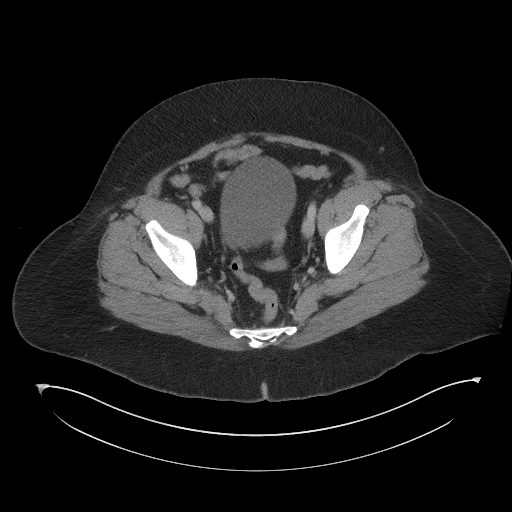
[im 36/85  soft-tissue]
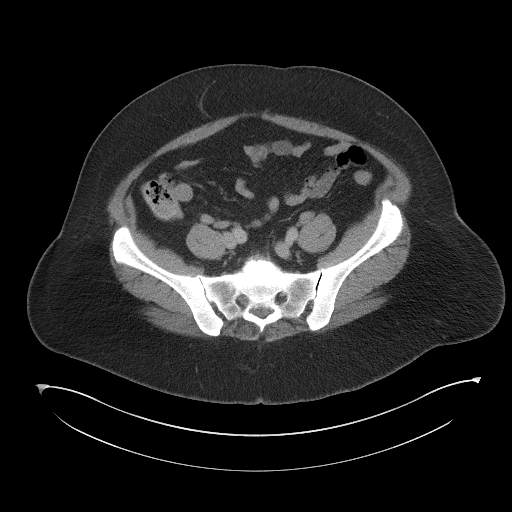
[im 40/85  soft-tissue]
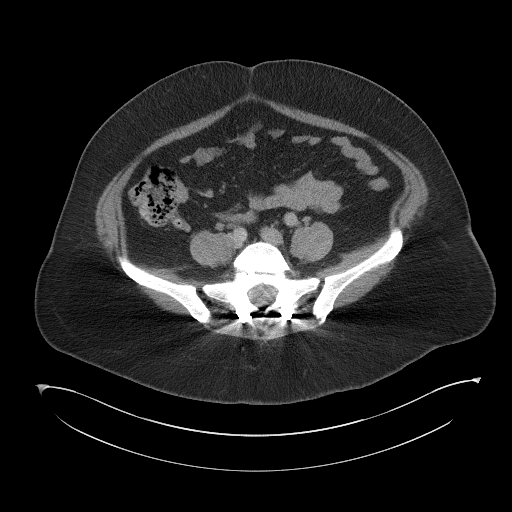
[im 45/85  soft-tissue]
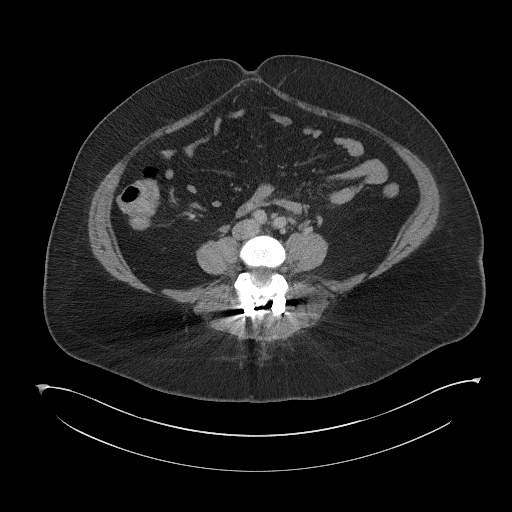
[im 49/85  soft-tissue]
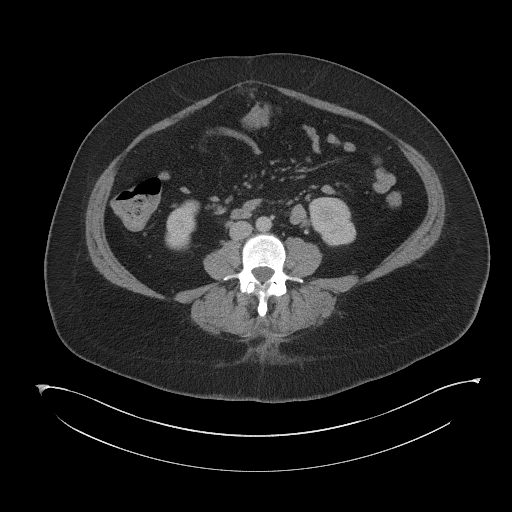
[im 49/85  bone]
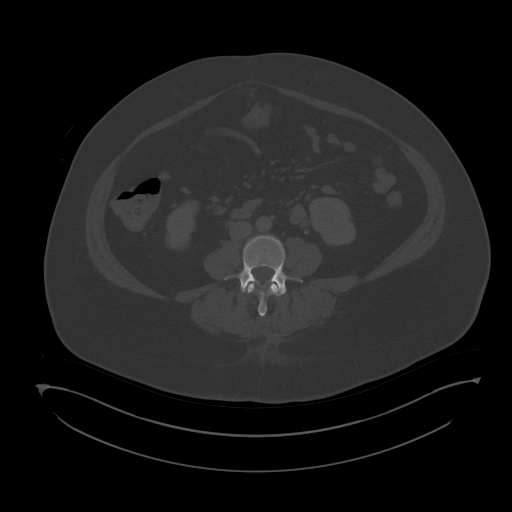
[im 58/85  soft-tissue]
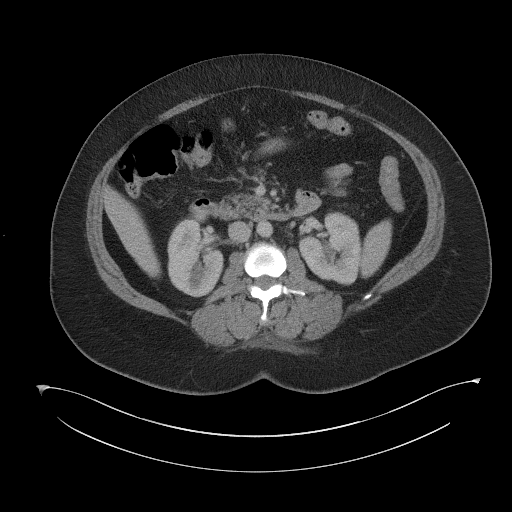
[im 62/85  soft-tissue]
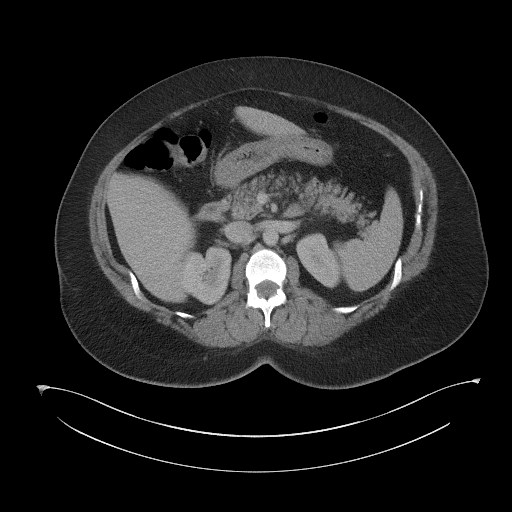
[im 67/85  soft-tissue]
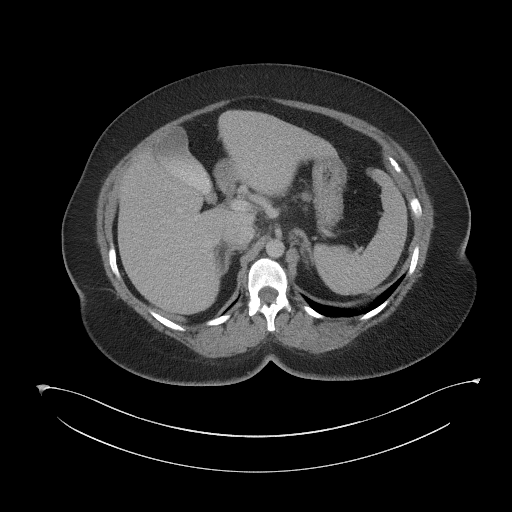
[im 76/85  soft-tissue]
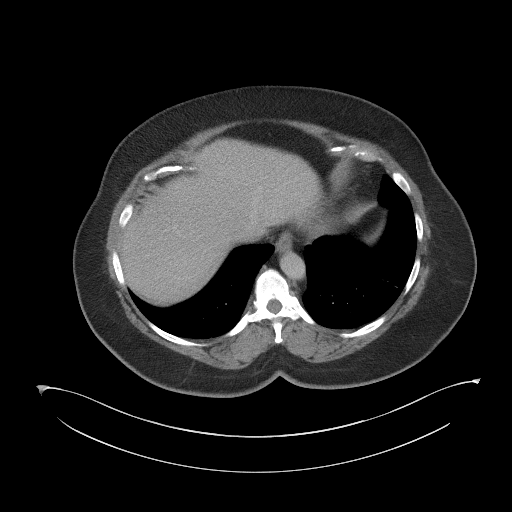
[im 80/85  soft-tissue]
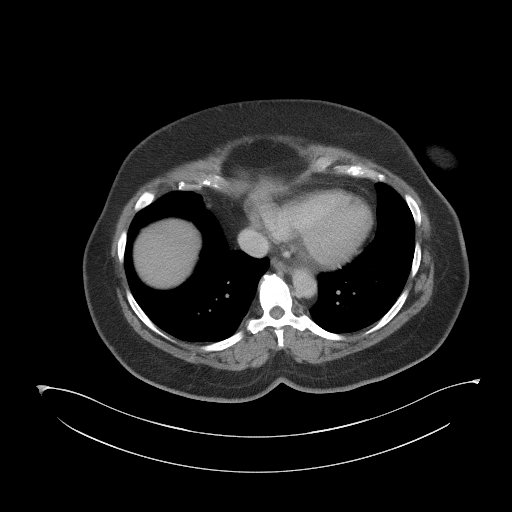

[Series 7: a/p w/ cor · coronal · 0.83mm/px · 3 of 179 slices shown]
[im 60/179  soft-tissue]
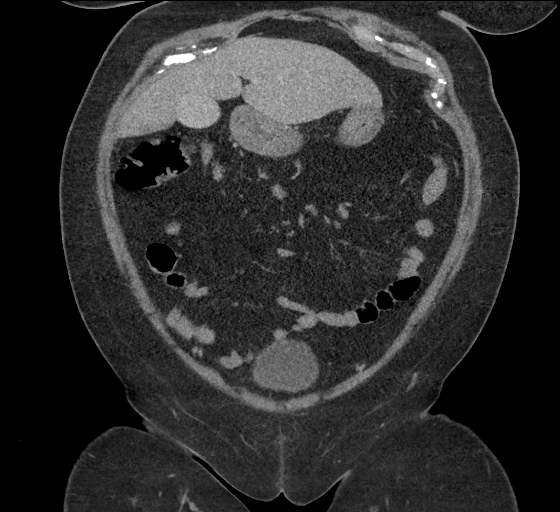
[im 80/179  soft-tissue]
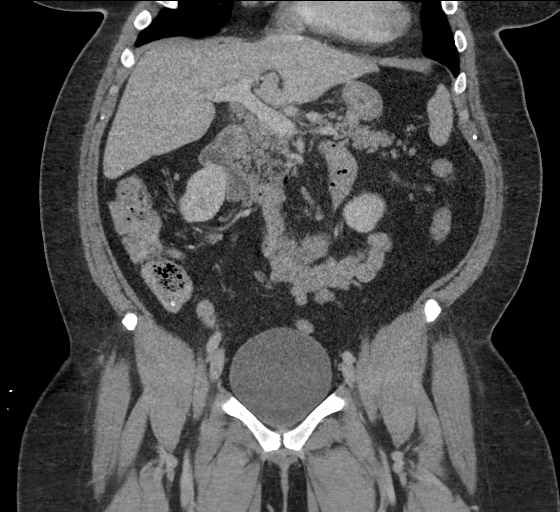
[im 99/179  soft-tissue]
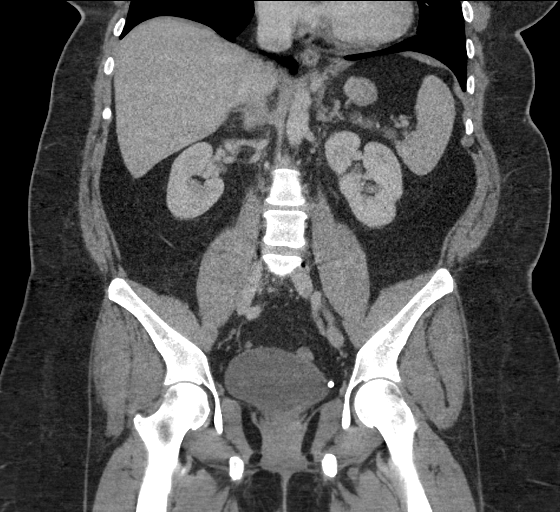

[17 of 46 positions shown; findings below may reference images not displayed]

RADIATION DOSE REDUCTION: This exam was performed according to the
departmental dose-optimization program which includes automated
exposure control, adjustment of the mA and/or kV according to
patient size and/or use of iterative reconstruction technique.

CONTRAST:  100mL OMNIPAQUE IOHEXOL 300 MG/ML  SOLN
FINDINGS: Lower Chest: Normal.

Hepatobiliary: Normal hepatic contours. No intra- or extrahepatic
biliary dilatation. Vicarious excretion of contrast media into the
gallbladder.

Pancreas: Normal pancreas. No ductal dilatation or peripancreatic
fluid collection.

Spleen: Normal.

Adrenals/Urinary Tract: The adrenal glands are normal.
Nonobstructive left nephrolithiasis. Unchanged appearance simple
right renal cysts (Bosniak class 1). The urinary bladder is normal
for degree of distention

Stomach/Bowel: There is no hiatal hernia. Normal duodenal course and
caliber. No small bowel dilatation or inflammation. No focal colonic
abnormality. Normal appendix.

Vascular/Lymphatic: Normal course and caliber of the major abdominal
vessels. No abdominal or pelvic lymphadenopathy.

Reproductive: Status post hysterectomy. No adnexal mass.

Other: Small fat containing ventral abdominal hernia.

Musculoskeletal: L5-S1 PLIF with grade 1 anterolisthesis.
IMPRESSION: 1. No acute abnormality of the abdomen or pelvis.
2. Nonobstructive left nephrolithiasis.

## 2024-02-28 ENCOUNTER — Other Ambulatory Visit (HOSPITAL_COMMUNITY): Payer: Self-pay

## 2024-03-04 ENCOUNTER — Other Ambulatory Visit: Payer: Self-pay | Admitting: Family Medicine

## 2024-03-05 ENCOUNTER — Other Ambulatory Visit: Payer: Self-pay | Admitting: Family Medicine

## 2024-03-06 ENCOUNTER — Other Ambulatory Visit: Payer: Self-pay | Admitting: Family Medicine

## 2024-04-04 ENCOUNTER — Other Ambulatory Visit: Payer: Self-pay | Admitting: Family Medicine

## 2024-05-18 ENCOUNTER — Other Ambulatory Visit: Payer: Self-pay | Admitting: *Deleted

## 2024-05-18 MED ORDER — CELECOXIB 200 MG PO CAPS: 200.0000 mg | ORAL_CAPSULE | Freq: Two times a day (BID) | ORAL | 0 refills | Status: DC

## 2024-05-28 ENCOUNTER — Other Ambulatory Visit: Payer: Self-pay | Admitting: Family Medicine

## 2024-06-21 ENCOUNTER — Ambulatory Visit: Payer: Self-pay | Admitting: *Deleted

## 2024-06-21 NOTE — Telephone Encounter (Signed)
 FYI Only or Action Required?: FYI only for provider: appointment scheduled on 11/6.  Patient was last seen in primary care on 12/28/2023 by Kennyth Worth HERO, MD.  Called Nurse Triage reporting Ear Drainage.  Symptoms began today.  Interventions attempted: Nothing.  Symptoms are: unchanged.  Triage Disposition: See Physician Within 24 Hours  Patient/caregiver understands and will follow disposition?: Yes  Copied from CRM [id number]. Topic: Clinical - Red Word Triage >> Jun 21, 2024  1:54 PM Macario HERO wrote: Red Word that prompted transfer to Nurse Triage: Patient right ear has pus coming out; where she had a tube due to frequent ear infections. Reason for Disposition  Yellow or green discharge (pus) from ear canal  Answer Assessment - Initial Assessment Questions 1. LOCATION: Which ear is involved?      Right ear 2. COLOR: What is the color of the discharge?      yellowish 3. CONSISTENCY: How runny is the discharge? Could it be water?      Runny- thicker than water 4. ONSET: When did you first notice the discharge?     Today- am 5. PAIN: Is there any earache? How bad is it?  (Scale 0-10; none, mild, moderate or severe)     Last night had pop- air travel, woke with discharge on pillow, no pain 6. OBJECTS: Have you put anything in your ear? (e.g., Q-tip, other object)      no 7. OTHER SYMPTOMS: Do you have any other symptoms? (e.g., headache, fever, dizziness, vomiting, runny nose)     Dizziness- couple weeks- hx syncope, sinus drainage  Protocols used: Ear - Discharge-A-AH

## 2024-06-22 ENCOUNTER — Encounter: Payer: Self-pay | Admitting: Family Medicine

## 2024-06-22 ENCOUNTER — Ambulatory Visit: Admitting: Family Medicine

## 2024-06-22 VITALS — BP 122/60 | HR 79 | Temp 97.5°F | Ht 65.0 in | Wt 252.0 lb

## 2024-06-22 DIAGNOSIS — Z7985 Long-term (current) use of injectable non-insulin antidiabetic drugs: Secondary | ICD-10-CM

## 2024-06-22 DIAGNOSIS — R059 Cough, unspecified: Secondary | ICD-10-CM

## 2024-06-22 DIAGNOSIS — E1169 Type 2 diabetes mellitus with other specified complication: Secondary | ICD-10-CM

## 2024-06-22 DIAGNOSIS — Z6841 Body Mass Index (BMI) 40.0 and over, adult: Secondary | ICD-10-CM

## 2024-06-22 DIAGNOSIS — H9201 Otalgia, right ear: Secondary | ICD-10-CM

## 2024-06-22 LAB — POC COVID19 BINAXNOW: SARS Coronavirus 2 Ag: NEGATIVE

## 2024-06-22 MED ORDER — TIRZEPATIDE 7.5 MG/0.5ML ~~LOC~~ SOAJ: 7.5000 mg | SUBCUTANEOUS | 0 refills | Status: DC

## 2024-06-22 MED ORDER — CIPROFLOXACIN HCL 0.2 % OT SOLN: 0.2000 mL | Freq: Two times a day (BID) | OTIC | 0 refills | Status: DC

## 2024-06-22 MED ORDER — AMOXICILLIN-POT CLAVULANATE 875-125 MG PO TABS: 1.0000 | ORAL_TABLET | Freq: Two times a day (BID) | ORAL | 0 refills | Status: DC

## 2024-06-22 NOTE — Progress Notes (Signed)
   Tracy Wise is a 53 y.o. female who presents today for an office visit.  Assessment/Plan:  New/Acute Problems: Otalgia Exam consistent with otitis media.  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  She is concerned about concurrent sinusitis as well and we will start course of Augmentin.  Encouraged hydration.  She will let us  know if not improving.  She can also follow-up with ENT if not improving.  Chronic Problems Addressed Today: T2DM (type 2 diabetes mellitus) (HCC) Currently on Mounjaro  5 mg weekly.  She has been tolerating well but has had intermittent coverage due to insurance changes.  She would like to increase the dose as she does not feel like it is helping significantly with appetite reduction.  To go to 7.5 mg weekly she will follow-up with us  in a month.  Will increase dose as tolerated.  Check A1c next office visit.  Morbid obesity (HCC) BMI 41.9.  We are increasing dose of Mounjaro  as above.     Subjective:  HPI:  See assessment / plan for status of chronic conditions.      Discussed the use of AI scribe software for clinical note transcription with the patient, who gave verbal consent to proceed.  History of Present Illness Tracy Wise is a 53 year old female who presents with symptoms of an ear infection and sinus infection.  She has a history of ear tubes and currently has a tube in her left ear. There is a history of a perforation in her right ear from previous tubes, which she suspects may be closing. After a recent trip to [place], she experienced a popping sensation in her right ear during the flight back, followed by purulent discharge from the right ear the next morning. No recent water exposure to the ears except for a minor incident six weeks ago during a hair appointment.  She suspects a sinus infection due to sinus drainage and has a history of  sinus infections. She typically uses Cipro  drops for ear infections but is allergic to amoxicillin, which limits her antibiotic options.  She has been experiencing issues with her weight management medication, Mounjaro , due to frequent changes in her insurance coverage. This has resulted in her being on and off the medication for the past six months, leading to weight gain. She missed her last dose before a recent vacation and has not taken it since returning due to concerns about infection.  She requests refills for Trulance  and her muscle relaxer, noting she may be out of refills for the latter.         Objective:  Physical Exam: BP 122/60   Pulse 79   Temp (!) 97.5 F (36.4 C) (Temporal)   Ht 5' 5 (1.651 m)   Wt 252 lb (114.3 kg)   BMI 41.93 kg/m   Gen: No acute distress, resting comfortably HEENT: Purulent debris in right EAC.  Perforation noted from site of previous tympanostomy tube.  Left TM clear with tympanostomy tube in place. CV: Regular rate and rhythm with no murmurs appreciated Pulm: Normal work of breathing, clear to auscultation bilaterally with no crackles, wheezes, or rhonchi Neuro: Grossly normal, moves all extremities Psych: Normal affect and thought content      [name] M. Kennyth, MD 06/22/2024 3:11 PM

## 2024-06-22 NOTE — Assessment & Plan Note (Signed)
 BMI 41.9.  We are increasing dose of Mounjaro  as above.

## 2024-06-22 NOTE — Assessment & Plan Note (Signed)
 Currently on Mounjaro  5 mg weekly.  She has been tolerating well but has had intermittent coverage due to insurance changes.  She would like to increase the dose as she does not feel like it is helping significantly with appetite reduction.  To go to 7.5 mg weekly she will follow-up with us  in a month.  Will increase dose as tolerated.  Check A1c next office visit.

## 2024-06-22 NOTE — Patient Instructions (Signed)
 It was very nice to see you today!  VISIT SUMMARY: During your visit, we addressed your ear and sinus infections, diabetes management, weight management, and chronic constipation. We have updated your medications and provided new prescriptions to help manage your conditions.  YOUR PLAN: ACUTE RIGHT EAR INFECTION WITH TYMPANIC MEMBRANE PERFORATION: You have an ear infection with a perforated eardrum, likely due to pressure changes during your flight. -Start using Ciprofloxacin  ear drops as prescribed. -Take Augmentin as prescribed.   TYPE 2 DIABETES MELLITUS: Your diabetes is not well controlled due to inconsistent use of Mounjaro . -Increase Mounjaro  to 7.5 mg, with a potential increase to 10 mg. -Monitor for nausea with the dose increase.    Return if symptoms worsen or fail to improve.   Take care, Dr Kennyth  PLEASE NOTE:  If you had any lab tests, please let us  know if you have not heard back within a few days. You may see your results on mychart before we have a chance to review them but we will give you a call once they are reviewed by us .   If we ordered any referrals today, please let us  know if you have not heard from their office within the next week.   If you had any urgent prescriptions sent in today, please check with the pharmacy within an hour of our visit to make sure the prescription was transmitted appropriately.   Please try these tips to maintain a healthy lifestyle:  Eat at least 3 REAL meals and 1-2 snacks per day.  Aim for no more than 5 hours between eating.  If you eat breakfast, please do so within one hour of getting up.   Each meal should contain half fruits/vegetables, one quarter protein, and one quarter carbs (no bigger than a computer mouse)  Cut down on sweet beverages. This includes juice, soda, and sweet tea.   Drink at least 1 glass of water with each meal and aim for at least 8 glasses per day  Exercise at least 150 minutes every week.

## 2024-06-23 ENCOUNTER — Other Ambulatory Visit: Payer: Self-pay | Admitting: Family Medicine

## 2024-06-25 ENCOUNTER — Other Ambulatory Visit: Payer: Self-pay | Admitting: Family Medicine

## 2024-06-29 ENCOUNTER — Ambulatory Visit: Admitting: Family Medicine

## 2024-07-21 ENCOUNTER — Other Ambulatory Visit: Payer: Self-pay | Admitting: Family Medicine

## 2024-07-25 ENCOUNTER — Ambulatory Visit: Admitting: Family Medicine

## 2024-07-31 ENCOUNTER — Other Ambulatory Visit: Payer: Self-pay | Admitting: Family Medicine

## 2024-07-31 ENCOUNTER — Encounter: Payer: Self-pay | Admitting: Family Medicine

## 2024-07-31 ENCOUNTER — Ambulatory Visit: Admitting: Family Medicine

## 2024-07-31 VITALS — BP 112/72 | HR 77 | Temp 97.8°F | Ht 65.0 in | Wt 255.0 lb

## 2024-07-31 DIAGNOSIS — K581 Irritable bowel syndrome with constipation: Secondary | ICD-10-CM

## 2024-07-31 DIAGNOSIS — H6993 Unspecified Eustachian tube disorder, bilateral: Secondary | ICD-10-CM

## 2024-07-31 DIAGNOSIS — E1169 Type 2 diabetes mellitus with other specified complication: Secondary | ICD-10-CM | POA: Diagnosis not present

## 2024-07-31 DIAGNOSIS — Z7985 Long-term (current) use of injectable non-insulin antidiabetic drugs: Secondary | ICD-10-CM | POA: Diagnosis not present

## 2024-07-31 LAB — POCT GLYCOSYLATED HEMOGLOBIN (HGB A1C): Hemoglobin A1C: 5.9 % — AB (ref 4.0–5.6)

## 2024-07-31 MED ORDER — TRULANCE 3 MG PO TABS: 3.0000 mg | ORAL_TABLET | Freq: Every day | ORAL | 3 refills | Status: DC | PRN

## 2024-07-31 MED ORDER — TIRZEPATIDE 10 MG/0.5ML ~~LOC~~ SOAJ: 10.0000 mg | SUBCUTANEOUS | 3 refills | Status: AC

## 2024-07-31 NOTE — Patient Instructions (Signed)
 It was very nice to see you today!  VISIT SUMMARY: You came in for a follow-up on your blood sugar management and ear congestion. Your diabetes is well-controlled with an A1c of 5.9, and we have adjusted your Mounjaro  dosage to help with weight management. We also discussed your ear congestion and gastrointestinal issues.  YOUR PLAN: TYPE 2 DIABETES MELLITUS: Your diabetes is well-controlled with an A1c of 5.9. -Continue taking Mounjaro , now increased to 10 mg weekly to help with weight management. Prescription sent to [facility] in [place].  MORBID OBESITY: Your weight is the highest it has ever been, and we are working on managing it. -Continue taking Mounjaro , now increased to 10 mg weekly to help with weight management.  OTITIS MEDIA WITH TYMPANIC MEMBRANE PERFORATION: You have a persistent perforation in your right eardrum without infection. -Referred to ENT for further evaluation. -Use a nasal flush device and take Allegra for congestion.  IRRITABLE BOWEL SYNDROME WITH CONSTIPATION: You have constipation managed with Trulance , and Mounjaro  may contribute to this issue. -Continue taking Trulance  as needed. Prescription refilled and sent to [facility] [place].  GENERAL HEALTH MAINTENANCE: You need to schedule a mammography appointment. -Schedule a mammography appointment at your earliest convenience.  Return in about 6 months (around 01/29/2025) for Follow Up.   Take care, Dr Kennyth  PLEASE NOTE:  If you had any lab tests, please let us  know if you have not heard back within a few days. You may see your results on mychart before we have a chance to review them but we will give you a call once they are reviewed by us .   If we ordered any referrals today, please let us  know if you have not heard from their office within the next week.   If you had any urgent prescriptions sent in today, please check with the pharmacy within an hour of our visit to make sure the prescription was  transmitted appropriately.   Please try these tips to maintain a healthy lifestyle:  Eat at least 3 REAL meals and 1-2 snacks per day.  Aim for no more than 5 hours between eating.  If you eat breakfast, please do so within one hour of getting up.   Each meal should contain half fruits/vegetables, one quarter protein, and one quarter carbs (no bigger than a computer mouse)  Cut down on sweet beverages. This includes juice, soda, and sweet tea.   Drink at least 1 glass of water with each meal and aim for at least 8 glasses per day  Exercise at least 150 minutes every week.

## 2024-07-31 NOTE — Assessment & Plan Note (Signed)
 A1c stable 5.9.  She is working on lifestyle interventions though has had difficulty with ongoing weight loss.  We will increase her Mounjaro  to 10 mg weekly.  She is aware of potential side effects.  She will follow-up with us  in a few weeks via MyChart we can adjust the dose as tolerated.  Recheck A1c in 3 to 6 months.

## 2024-07-31 NOTE — Progress Notes (Signed)
° °  KARL KNARR is a 53 y.o. female who presents today for an office visit.  Assessment/Plan:  Chronic Problems Addressed Today: T2DM (type 2 diabetes mellitus) (HCC) A1c stable 5.9.  She is working on lifestyle interventions though has had difficulty with ongoing weight loss.  We will increase her Mounjaro  to 10 mg weekly.  She is aware of potential side effects.  She will follow-up with us  in a few weeks via MyChart we can adjust the dose as tolerated.  Recheck A1c in 3 to 6 months.  IBS (irritable bowel syndrome) She last saw GI couple of years ago.  She uses Trulance  as needed.  We will refill this today.  Dysfunction of both eustachian tubes Left tympanostomy tube in place.  Right TM with a large perforation from previous tympanostomy.  She still feels sensation of fullness in her right ear.  Advised her to follow-up with ENT soon.     Subjective:  HPI:  See assessment / plan for status of chronic conditions.   Discussed the use of AI scribe software for clinical note transcription with the patient, who gave verbal consent to proceed.  History of Present Illness Andersyn C Carmean Charmaine is a 53 year old female with diabetes who presents for follow-up of her blood sugar management and ear congestion.  Her A1c is currently 5.9. She has resumed taking Mounjaro , with her third dose taken today, and notes that it is helping to reduce her appetite. Her weight is the highest it has ever been, which she finds frustrating due to changes in her insurance affecting her medication regimen over the summer. She is currently on a 7.5 mg dose of Mounjaro .  She feels that her ears are still a bit clogged, although she does not experience any pain. She has a history of ear tubes, with the last one placed over a year ago, which resulted in a persistent hole in her ear. She describes the sensation as similar to ear pressure experienced during a flight. She has not used Flonase  due to disliking it  but has a nasal flush device that she considers using.  She requests a refill of Trulance , which she uses as needed for gastrointestinal issues. She has not seen her gastroenterologist in a couple of years since her last stomach issue.  She mentions needing to schedule a mammography appointment and is considering returning to the same facility where her previous mammograms were done.  No ear pain, fever, or discharge.         Objective:  Physical Exam: BP 112/72   Pulse 77   Temp 97.8 F (36.6 C) (Temporal)   Ht 5' 5 (1.651 m)   Wt 255 lb (115.7 kg)   SpO2 98%   BMI 42.43 kg/m   Wt Readings from Last 3 Encounters:  07/31/24 255 lb (115.7 kg)  06/22/24 252 lb (114.3 kg)  12/28/23 238 lb 9.6 oz (108.2 kg)    Gen: No acute distress, resting comfortably HEENT: Perforation noted right TM.  Left TM with tube in place CV: Regular rate and rhythm with no murmurs appreciated Pulm: Normal work of breathing, clear to auscultation bilaterally with no crackles, wheezes, or rhonchi Neuro: Grossly normal, moves all extremities Psych: Normal affect and thought content      Jihan Mellette M. Kennyth, MD 07/31/2024 11:57 AM

## 2024-07-31 NOTE — Assessment & Plan Note (Addendum)
 She last saw GI couple of years ago.  She uses Trulance  as needed.  We will refill this today.

## 2024-07-31 NOTE — Assessment & Plan Note (Signed)
 Left tympanostomy tube in place.  Right TM with a large perforation from previous tympanostomy.  She still feels sensation of fullness in her right ear.  Advised her to follow-up with ENT soon.

## 2024-08-01 ENCOUNTER — Telehealth: Payer: Self-pay

## 2024-08-01 ENCOUNTER — Encounter: Payer: Self-pay | Admitting: Family Medicine

## 2024-08-01 ENCOUNTER — Other Ambulatory Visit (HOSPITAL_COMMUNITY): Payer: Self-pay

## 2024-08-01 NOTE — Telephone Encounter (Signed)
Please start PA thanks.

## 2024-08-01 NOTE — Telephone Encounter (Signed)
 Pharmacy Patient Advocate Encounter   Received notification from Pt Calls Messages that prior authorization for Trulance  3MG  tablets is required/requested.   Insurance verification completed.   The patient is insured through [facility].   Per test claim: PA required; PA submitted to above mentioned insurance via Latent Key/confirmation #/EOC [id number] Status is pending

## 2024-08-03 NOTE — Telephone Encounter (Signed)
 Insurance is asking clinical question..I don't see anything on chart.SABRA

## 2024-08-04 NOTE — Telephone Encounter (Signed)
 I refilled a medication(s) that her gastroenterology had refilled previously. We can let patient know her insurance will need her to try the alternatives.

## 2024-08-04 NOTE — Telephone Encounter (Signed)
 Pharmacy Patient Advocate Encounter  Received notification from [employer] that Prior Authorization for  Trulance  3MG  tablets  has been DENIED.  Full denial letter will be uploaded to the media tab. See denial reason below.   PA #/Case ID/Reference #: [id number]

## 2024-08-07 NOTE — Telephone Encounter (Signed)
 Please advise on another treatment plan or next steps as patients insurance has denied Trulance .

## 2024-08-08 NOTE — Telephone Encounter (Signed)
 Patient notified of Dr. Shaune note via MyChart. Awaiting response for patient to agree to see GI or start suggested new treatment plan.

## 2024-08-14 NOTE — Telephone Encounter (Signed)
" °  Pharmacy comment: Alternative Requested:THIS MEDICATION IS NOT COVERED UNDER INSURANCE, PLEASE CONSIDER CHANGING TO AN ALTERNATIVE. THANKS!   "

## 2024-08-20 ENCOUNTER — Other Ambulatory Visit: Payer: Self-pay | Admitting: Family Medicine

## 2024-08-27 ENCOUNTER — Other Ambulatory Visit: Payer: Self-pay | Admitting: Family Medicine

## 2024-09-22 ENCOUNTER — Other Ambulatory Visit: Payer: Self-pay | Admitting: Family Medicine
